# Patient Record
Sex: Female | Born: 1970 | Hispanic: Yes | Marital: Single | Smoking: Never smoker
Health system: Southern US, Community
[De-identification: ages and names within clinical notes are randomized; demographics above are authoritative.]

## PROBLEM LIST (undated history)

## (undated) DIAGNOSIS — I10 Essential (primary) hypertension: Secondary | ICD-10-CM

## (undated) DIAGNOSIS — G8929 Other chronic pain: Secondary | ICD-10-CM

## (undated) DIAGNOSIS — R109 Unspecified abdominal pain: Secondary | ICD-10-CM

## (undated) DIAGNOSIS — G43909 Migraine, unspecified, not intractable, without status migrainosus: Secondary | ICD-10-CM

## (undated) HISTORY — PX: HEMORROIDECTOMY: SUR656

## (undated) HISTORY — PX: TUBAL LIGATION: SHX77

---

## 2005-09-05 ENCOUNTER — Ambulatory Visit: Payer: Self-pay | Admitting: Family Medicine

## 2005-10-14 ENCOUNTER — Ambulatory Visit: Payer: Self-pay | Admitting: Family Medicine

## 2007-01-15 ENCOUNTER — Emergency Department: Payer: Self-pay

## 2013-06-16 ENCOUNTER — Ambulatory Visit: Payer: Self-pay | Admitting: Internal Medicine

## 2013-07-21 DIAGNOSIS — K219 Gastro-esophageal reflux disease without esophagitis: Secondary | ICD-10-CM | POA: Insufficient documentation

## 2013-07-21 DIAGNOSIS — G43909 Migraine, unspecified, not intractable, without status migrainosus: Secondary | ICD-10-CM | POA: Insufficient documentation

## 2014-08-02 DIAGNOSIS — I10 Essential (primary) hypertension: Secondary | ICD-10-CM | POA: Insufficient documentation

## 2014-09-06 ENCOUNTER — Other Ambulatory Visit: Payer: Self-pay | Admitting: Internal Medicine

## 2014-09-06 DIAGNOSIS — R1013 Epigastric pain: Secondary | ICD-10-CM

## 2014-09-12 ENCOUNTER — Ambulatory Visit: Payer: Self-pay

## 2014-09-20 ENCOUNTER — Ambulatory Visit
Admission: RE | Admit: 2014-09-20 | Discharge: 2014-09-20 | Disposition: A | Discharge status: Home / Self Care | Payer: Managed Care, Other (non HMO) | Source: Ambulatory Visit | Attending: Internal Medicine | Admitting: Internal Medicine

## 2014-09-20 DIAGNOSIS — R1013 Epigastric pain: Secondary | ICD-10-CM | POA: Diagnosis present

## 2015-02-23 DIAGNOSIS — R519 Headache, unspecified: Secondary | ICD-10-CM | POA: Insufficient documentation

## 2015-02-23 DIAGNOSIS — R51 Headache: Secondary | ICD-10-CM

## 2015-04-26 ENCOUNTER — Other Ambulatory Visit: Payer: Self-pay | Admitting: Internal Medicine

## 2015-04-26 DIAGNOSIS — D649 Anemia, unspecified: Secondary | ICD-10-CM | POA: Insufficient documentation

## 2015-04-26 DIAGNOSIS — K644 Residual hemorrhoidal skin tags: Secondary | ICD-10-CM | POA: Insufficient documentation

## 2015-04-26 DIAGNOSIS — Z1239 Encounter for other screening for malignant neoplasm of breast: Secondary | ICD-10-CM

## 2015-05-08 ENCOUNTER — Ambulatory Visit: Payer: Managed Care, Other (non HMO)

## 2016-03-20 ENCOUNTER — Emergency Department
Admission: EM | Admit: 2016-03-20 | Discharge: 2016-03-20 | Disposition: A | Discharge status: Home / Self Care | Payer: Managed Care, Other (non HMO) | Attending: Student in an Organized Health Care Education/Training Program | Admitting: Student in an Organized Health Care Education/Training Program

## 2016-03-20 ENCOUNTER — Emergency Department: Payer: Managed Care, Other (non HMO)

## 2016-03-20 ENCOUNTER — Encounter: Payer: Self-pay | Admitting: Emergency Medicine

## 2016-03-20 DIAGNOSIS — I10 Essential (primary) hypertension: Secondary | ICD-10-CM | POA: Diagnosis not present

## 2016-03-20 DIAGNOSIS — R109 Unspecified abdominal pain: Secondary | ICD-10-CM | POA: Diagnosis present

## 2016-03-20 DIAGNOSIS — R1011 Right upper quadrant pain: Secondary | ICD-10-CM

## 2016-03-20 DIAGNOSIS — R1013 Epigastric pain: Secondary | ICD-10-CM

## 2016-03-20 HISTORY — DX: Essential (primary) hypertension: I10

## 2016-03-20 HISTORY — DX: Unspecified abdominal pain: R10.9

## 2016-03-20 HISTORY — DX: Migraine, unspecified, not intractable, without status migrainosus: G43.909

## 2016-03-20 HISTORY — DX: Other chronic pain: G89.29

## 2016-03-20 LAB — COMPREHENSIVE METABOLIC PANEL
ALBUMIN: 4.1 g/dL (ref 3.5–5.0)
ALK PHOS: 62 U/L (ref 38–126)
ALT: 15 U/L (ref 14–54)
AST: 21 U/L (ref 15–41)
Anion gap: 5 (ref 5–15)
BILIRUBIN TOTAL: 0.6 mg/dL (ref 0.3–1.2)
BUN: 15 mg/dL (ref 6–20)
CALCIUM: 9.3 mg/dL (ref 8.9–10.3)
CO2: 22 mmol/L (ref 22–32)
CREATININE: 0.6 mg/dL (ref 0.44–1.00)
Chloride: 110 mmol/L (ref 101–111)
GFR calc Af Amer: >60 mL/min (ref >60)
GFR calc non Af Amer: >60 mL/min (ref >60)
GLUCOSE: 84 mg/dL (ref 65–99)
Potassium: 4.1 mmol/L (ref 3.5–5.1)
SODIUM: 137 mmol/L (ref 135–145)
TOTAL PROTEIN: 7.8 g/dL (ref 6.5–8.1)

## 2016-03-20 LAB — URINALYSIS, COMPLETE (UACMP) WITH MICROSCOPIC
Bacteria, UA: NONE SEEN
Bilirubin Urine: NEGATIVE
GLUCOSE, UA: NEGATIVE mg/dL
Ketones, ur: NEGATIVE mg/dL
Leukocytes, UA: NEGATIVE
NITRITE: NEGATIVE
PH: 5 (ref 5.0–8.0)
Protein, ur: NEGATIVE mg/dL
SPECIFIC GRAVITY, URINE: 1.016 (ref 1.005–1.030)

## 2016-03-20 LAB — LIPASE, BLOOD: Lipase: 37 U/L (ref 11–51)

## 2016-03-20 LAB — CBC
HCT: 39.1 % (ref 35.0–47.0)
Hemoglobin: 13.5 g/dL (ref 12.0–16.0)
MCH: 31.6 pg (ref 26.0–34.0)
MCHC: 34.6 g/dL (ref 32.0–36.0)
MCV: 91.4 fL (ref 80.0–100.0)
PLATELETS: 274 10*3/uL (ref 150–440)
RBC: 4.28 MIL/uL (ref 3.80–5.20)
RDW: 13.7 % (ref 11.5–14.5)
WBC: 6.2 10*3/uL (ref 3.6–11.0)

## 2016-03-20 LAB — POCT PREGNANCY, URINE: Preg Test, Ur: NEGATIVE

## 2016-03-20 MED ORDER — PANTOPRAZOLE SODIUM 20 MG PO TBEC: 20.0000 mg | DELAYED_RELEASE_TABLET | Freq: Two times a day (BID) | ORAL | 0 refills | Status: DC

## 2016-03-20 MED ORDER — DICYCLOMINE HCL 10 MG PO CAPS: 10.0000 mg | ORAL_CAPSULE | Freq: Three times a day (TID) | ORAL | 0 refills | Status: DC | PRN

## 2016-03-20 MED ORDER — DICYCLOMINE HCL 10 MG PO CAPS
10.0000 mg | ORAL_CAPSULE | Freq: Once | ORAL | Status: AC
  Administered 2016-03-20: 10 mg via ORAL
  Filled 2016-03-20 (×2): qty 1

## 2016-03-20 MED ORDER — GI COCKTAIL ~~LOC~~
30.0000 mL | Freq: Once | ORAL | Status: AC
  Administered 2016-03-20: 30 mL via ORAL
  Filled 2016-03-20: qty 30

## 2016-03-20 NOTE — ED Provider Notes (Signed)
[facility] Emergency Department Provider Note    First MD Initiated Contact with Patient 03/20/16 1404     (approximate)  I have reviewed the triage vital signs and the nursing notes.   HISTORY  Chief Complaint Abdominal Pain    HPI Ashley Mendoza is a 46 y.o. female  with a history of chronic abdominal pain presents with 3 weeks of progressively worsening postprandial abdominal pain.  This does have a family history of gallstones. States she's had this episode similar to this in the past but none particularly this worse. Currently rates her pain as 10 on a 10 in severity. No fevers. Does feel nauseated after eating but has not had any vomiting. No blood in her stools. Some breath or chest pain. Denies any dysuria or flank pain. States the pain occurs anywhere between 10 and 15 minutes after eating.   Past Medical History:  Diagnosis Date  . Chronic abdominal pain   . Hypertension   . Migraine    History reviewed. No pertinent family history. Past Surgical History:  Procedure Laterality Date  . HEMORROIDECTOMY    . TUBAL LIGATION     There are no active problems to display for this patient.     Prior to Admission medications   Not on File    Allergies Patient has no known allergies.    Social History Social History  Substance Use Topics  . Smoking status: Never Smoker  . Smokeless tobacco: Never Used  . Alcohol use No    Review of Systems Patient denies headaches, rhinorrhea, blurry vision, numbness, shortness of breath, chest pain, edema, cough, abdominal pain, nausea, vomiting, diarrhea, dysuria, fevers, rashes or hallucinations unless otherwise stated above in HPI. ____________________________________________   PHYSICAL EXAM:  VITAL SIGNS: Vitals:   03/20/16 1040  BP: (!) 154/91  Pulse: 70  Resp: 18  Temp: 97.5 F (36.4 C)    Constitutional: Alert and oriented. Well appearing and in no acute distress. Eyes:  Conjunctivae are normal. PERRL. EOMI. Head: Atraumatic. Nose: No congestion/rhinnorhea. Mouth/Throat: Mucous membranes are moist.  Oropharynx non-erythematous. Neck: No stridor. Painless ROM. No cervical spine tenderness to palpation Hematological/Lymphatic/Immunilogical: No cervical lymphadenopathy. Cardiovascular: Normal rate, regular rhythm. Grossly normal heart sounds.  Good peripheral circulation. Respiratory: Normal respiratory effort.  No retractions. Lungs CTAB. Gastrointestinal: Soft and nontender. No distention. No abdominal bruits. No CVA tenderness. Musculoskeletal: No lower extremity tenderness nor edema.  No joint effusions. Neurologic:  Normal speech and language. No gross focal neurologic deficits are appreciated. No gait instability. Skin:  Skin is warm, dry and intact. No rash noted. Psychiatric: Mood and affect are normal. Speech and behavior are normal.  ____________________________________________   LABS (all labs ordered are listed, but only abnormal results are displayed)  Results for orders placed or performed during the hospital encounter of 03/20/16 (from the past 24 hour(s))  Lipase, blood     Status: None   Collection Time: 03/20/16 10:41 AM  Result Value Ref Range   Lipase 37 11 - 51 U/L  Comprehensive metabolic panel     Status: None   Collection Time: 03/20/16 10:41 AM  Result Value Ref Range   Sodium 137 135 - 145 mmol/L   Potassium 4.1 3.5 - 5.1 mmol/L   Chloride 110 101 - 111 mmol/L   CO2 22 22 - 32 mmol/L   Glucose, Bld 84 65 - 99 mg/dL   BUN 15 6 - 20 mg/dL   Creatinine, Ser 1.61 0.44 - 1.00  mg/dL   Calcium 9.3 8.9 - 16.110.3 mg/dL   Total Protein 7.8 6.5 - 8.1 g/dL   Albumin 4.1 3.5 - 5.0 g/dL   AST 21 15 - 41 U/L   ALT 15 14 - 54 U/L   Alkaline Phosphatase 62 38 - 126 U/L   Total Bilirubin 0.6 0.3 - 1.2 mg/dL   GFR calc non Af Amer >60 >60 mL/min   GFR calc Af Amer >60 >60 mL/min   Anion gap 5 5 - 15  CBC     Status: None   Collection  Time: 03/20/16 10:41 AM  Result Value Ref Range   WBC 6.2 3.6 - 11.0 K/uL   RBC 4.28 3.80 - 5.20 MIL/uL   Hemoglobin 13.5 12.0 - 16.0 g/dL   HCT 09.639.1 04.535.0 - 40.947.0 %   MCV 91.4 80.0 - 100.0 fL   MCH 31.6 26.0 - 34.0 pg   MCHC 34.6 32.0 - 36.0 g/dL   RDW 81.113.7 91.411.5 - 78.214.5 %   Platelets 274 150 - 440 K/uL  Urinalysis, Complete w Microscopic     Status: Abnormal   Collection Time: 03/20/16 10:41 AM  Result Value Ref Range   Color, Urine YELLOW (A) YELLOW   APPearance CLEAR (A) CLEAR   Specific Gravity, Urine 1.016 1.005 - 1.030   pH 5.0 5.0 - 8.0   Glucose, UA NEGATIVE NEGATIVE mg/dL   Hgb urine dipstick MODERATE (A) NEGATIVE   Bilirubin Urine NEGATIVE NEGATIVE   Ketones, ur NEGATIVE NEGATIVE mg/dL   Protein, ur NEGATIVE NEGATIVE mg/dL   Nitrite NEGATIVE NEGATIVE   Leukocytes, UA NEGATIVE NEGATIVE   RBC / HPF 0-5 0 - 5 RBC/hpf   WBC, UA 0-5 0 - 5 WBC/hpf   Bacteria, UA NONE SEEN NONE SEEN   Squamous Epithelial / LPF 6-30 (A) NONE SEEN   Mucous PRESENT   Pregnancy, urine POC     Status: None   Collection Time: 03/20/16 10:52 AM  Result Value Ref Range   Preg Test, Ur NEGATIVE NEGATIVE   ____________________________________________ ____________________________________________  RADIOLOGY  I personally reviewed all radiographic images ordered to evaluate for the above acute complaints and reviewed radiology reports and findings.  These findings were personally discussed with the patient.  Please see medical record for radiology report.  ____________________________________________   PROCEDURES  Procedure(s) performed:  Procedures    Critical Care performed: no ____________________________________________   INITIAL IMPRESSION / ASSESSMENT AND PLAN / ED COURSE  Pertinent labs & imaging results that were available during my care of the patient were reviewed by me and considered in my medical decision making (see chart for details).  DDX: cholelithiasis, biliary colic,  gastritis, pud, esophagitis  Ashley Mendoza is a 46 y.o. who presents to the ED with complaint of postprandial abdominal pain that is been worsening over the past 3 weeks. Patient is afebrile and hemodynamically stable. Her abdominal exam is soft and benign. Her blood work is reassuring.  I do suspect this is some form of gastritis versus ulcer disease. Also the differential is biliary colic or cholelithiasis. We'll order a right upper quadrant ultrasound to evaluate for any stones or obstruction. We'll provide symptomatically management here in the ER.  Clinical Course as of Mar 21 1515  Wed Mar 20, 2016  1507 Vital quadrant ultrasound with no evidence of cholelithiasis. Presentation was consistent with gastritis. We'll start patient on PPI and follow-up with PCP.  Patient was able to tolerate PO and was able to ambulate with a  steady gait.  Have discussed with the patient and available family all diagnostics and treatments performed thus far and all questions were answered to the best of my ability. The patient demonstrates understanding and agreement with plan.   [PR]    Clinical Course User Index [PR] Willy Eddy, MD     ____________________________________________   FINAL CLINICAL IMPRESSION(S) / ED DIAGNOSES  Final diagnoses:  RUQ pain  Epigastric pain      NEW MEDICATIONS STARTED DURING THIS VISIT:  New Prescriptions   No medications on file     Note:  This document was prepared using Dragon voice recognition software and may include unintentional dictation errors.    Willy Eddy, MD 03/20/16 [phone]

## 2016-03-20 NOTE — ED Triage Notes (Signed)
C/o abdominal pain since a child but then over last 3 weeks has been worse.  Reports feels bloated when eats fruit. Has had nausea. No distress noted, appears well in triage.

## 2016-03-20 NOTE — ED Notes (Signed)
Pain upper abdomen and bloating for long time.  Has gotten worse for few days.  Says increases sometimes after eating, but it also hurts in am when she gets up.

## 2016-05-19 ENCOUNTER — Inpatient Hospital Stay
Admission: EM | Admit: 2016-05-19 | Discharge: 2016-05-21 | DRG: 669 | Disposition: A | Discharge status: Home / Self Care | Payer: 59 | Attending: Internal Medicine | Admitting: Internal Medicine

## 2016-05-19 ENCOUNTER — Emergency Department: Payer: 59

## 2016-05-19 DIAGNOSIS — K219 Gastro-esophageal reflux disease without esophagitis: Secondary | ICD-10-CM | POA: Diagnosis present

## 2016-05-19 DIAGNOSIS — N23 Unspecified renal colic: Secondary | ICD-10-CM | POA: Diagnosis present

## 2016-05-19 DIAGNOSIS — G8929 Other chronic pain: Secondary | ICD-10-CM | POA: Diagnosis present

## 2016-05-19 DIAGNOSIS — N201 Calculus of ureter: Secondary | ICD-10-CM | POA: Diagnosis not present

## 2016-05-19 DIAGNOSIS — Z79899 Other long term (current) drug therapy: Secondary | ICD-10-CM

## 2016-05-19 DIAGNOSIS — Z8679 Personal history of other diseases of the circulatory system: Secondary | ICD-10-CM

## 2016-05-19 DIAGNOSIS — N132 Hydronephrosis with renal and ureteral calculous obstruction: Secondary | ICD-10-CM | POA: Diagnosis present

## 2016-05-19 DIAGNOSIS — N200 Calculus of kidney: Secondary | ICD-10-CM | POA: Diagnosis present

## 2016-05-19 DIAGNOSIS — R1031 Right lower quadrant pain: Secondary | ICD-10-CM

## 2016-05-19 LAB — COMPREHENSIVE METABOLIC PANEL
ALBUMIN: 4.1 g/dL (ref 3.5–5.0)
ALT: 14 U/L (ref 14–54)
ANION GAP: 6 (ref 5–15)
AST: 23 U/L (ref 15–41)
Alkaline Phosphatase: 53 U/L (ref 38–126)
BILIRUBIN TOTAL: 0.4 mg/dL (ref 0.3–1.2)
BUN: 10 mg/dL (ref 6–20)
CHLORIDE: 108 mmol/L (ref 101–111)
CO2: 24 mmol/L (ref 22–32)
Calcium: 9 mg/dL (ref 8.9–10.3)
Creatinine, Ser: 0.63 mg/dL (ref 0.44–1.00)
GFR calc Af Amer: >60 mL/min (ref >60)
GFR calc non Af Amer: >60 mL/min (ref >60)
GLUCOSE: 133 mg/dL — AB (ref 65–99)
POTASSIUM: 4 mmol/L (ref 3.5–5.1)
SODIUM: 138 mmol/L (ref 135–145)
TOTAL PROTEIN: 7.5 g/dL (ref 6.5–8.1)

## 2016-05-19 LAB — CBC WITH DIFFERENTIAL/PLATELET
BASOS ABS: 0 10*3/uL (ref 0–0.1)
BASOS PCT: 0 %
EOS ABS: 0 10*3/uL (ref 0–0.7)
Eosinophils Relative: 0 %
HCT: 38 % (ref 35.0–47.0)
Hemoglobin: 13.1 g/dL (ref 12.0–16.0)
Lymphocytes Relative: 16 %
Lymphs Abs: 2 10*3/uL (ref 1.0–3.6)
MCH: 31.3 pg (ref 26.0–34.0)
MCHC: 34.4 g/dL (ref 32.0–36.0)
MCV: 91.1 fL (ref 80.0–100.0)
MONO ABS: 0.4 10*3/uL (ref 0.2–0.9)
MONOS PCT: 4 %
NEUTROS ABS: 9.8 10*3/uL — AB (ref 1.4–6.5)
Neutrophils Relative %: 80 %
PLATELETS: 315 10*3/uL (ref 150–440)
RBC: 4.18 MIL/uL (ref 3.80–5.20)
RDW: 13.6 % (ref 11.5–14.5)
WBC: 12.3 10*3/uL — ABNORMAL HIGH (ref 3.6–11.0)

## 2016-05-19 LAB — URINALYSIS, COMPLETE (UACMP) WITH MICROSCOPIC
BILIRUBIN URINE: NEGATIVE
GLUCOSE, UA: NEGATIVE mg/dL
KETONES UR: NEGATIVE mg/dL
Leukocytes, UA: NEGATIVE
Nitrite: NEGATIVE
PH: 6 (ref 5.0–8.0)
PROTEIN: NEGATIVE mg/dL
Specific Gravity, Urine: 1.016 (ref 1.005–1.030)

## 2016-05-19 LAB — LIPASE, BLOOD: Lipase: 19 U/L (ref 11–51)

## 2016-05-19 LAB — POCT PREGNANCY, URINE: Preg Test, Ur: NEGATIVE

## 2016-05-19 MED ORDER — FENTANYL CITRATE (PF) 100 MCG/2ML IJ SOLN
50.0000 ug | Freq: Once | INTRAMUSCULAR | Status: AC
  Administered 2016-05-19: 50 ug via INTRAVENOUS

## 2016-05-19 MED ORDER — FENTANYL CITRATE (PF) 100 MCG/2ML IJ SOLN
50.0000 ug | INTRAMUSCULAR | Status: DC | PRN
  Administered 2016-05-19: 50 ug via NASAL
  Filled 2016-05-19: qty 2

## 2016-05-19 MED ORDER — MORPHINE SULFATE (PF) 4 MG/ML IV SOLN
INTRAVENOUS | Status: AC
  Administered 2016-05-19: 4 mg via INTRAVENOUS
  Filled 2016-05-19: qty 1

## 2016-05-19 MED ORDER — FENTANYL CITRATE (PF) 100 MCG/2ML IJ SOLN
INTRAMUSCULAR | Status: AC
  Administered 2016-05-19: 50 ug via NASAL
  Filled 2016-05-19: qty 2

## 2016-05-19 MED ORDER — KETOROLAC TROMETHAMINE 30 MG/ML IJ SOLN
30.0000 mg | Freq: Once | INTRAMUSCULAR | Status: AC
  Administered 2016-05-19: 30 mg via INTRAVENOUS
  Filled 2016-05-19: qty 1

## 2016-05-19 MED ORDER — MORPHINE SULFATE (PF) 4 MG/ML IV SOLN
4.0000 mg | Freq: Once | INTRAVENOUS | Status: AC
  Administered 2016-05-19 (×2): 4 mg via INTRAVENOUS

## 2016-05-19 MED ORDER — SODIUM CHLORIDE 0.9 % IV BOLUS (SEPSIS)
1000.0000 mL | Freq: Once | INTRAVENOUS | Status: AC
  Administered 2016-05-19: 1000 mL via INTRAVENOUS

## 2016-05-19 MED ORDER — ONDANSETRON HCL 4 MG/2ML IJ SOLN
4.0000 mg | Freq: Once | INTRAMUSCULAR | Status: AC
  Administered 2016-05-19: 4 mg via INTRAVENOUS
  Filled 2016-05-19: qty 2

## 2016-05-19 NOTE — ED Notes (Signed)
Pt with right flank pain, nausea, emesis for 4 hours. Pt states history of renal calculi in past. Pt denies known fever. Pt writhing around in bed during assessment prior to administration of pain medication. Pt with warm and dry skin, normal color.

## 2016-05-19 NOTE — H&P (Signed)
History and Physical   [facility] [place] [facility] Admission History and Physical [employer] Lyndsay Talamante, D.O.    Patient Name: Ashley Mendoza MR#: [id number] Date of Birth: 04/11/1970 Date of Admission: 05/19/2016  Referring MD/NP/PA: Dr. Pershing ProudSchaevitz Primary Care Physician: Leotis ShamesSingh,Jasmine, MD  Patient coming from: Home  Chief Complaint: Flank pain  HPI: Ashley Dikesngrid Gerstner is a 46 y.o. female with a known history of GERD, chronic abdominal pain, hypertension, migraine, nephrolithiasis with stent in her 920s  was in a usual state of health until earlier this evening when she describes sudden onset of right flank pain radiating around to her groin associated with nausea, vomiting generally feeling unwell. She denies any fevers, chills.   Otherwise there has been no change in status. Patient has been taking medication as prescribed and there has been no recent change in medication or diet.  No recent antibiotics.  There has been no recent illness, hospitalizations, travel or sick contacts.    Patient denies fevers/chills, weakness, dizziness, chest pain, shortness of breath, N/V/C/D, abdominal pain, dysuria/frequency, changes in mental status.   ED Course: Patient rec'd Toradol, fentanyl, IVFs  Review of Systems:  CONSTITUTIONAL: No fever/chills, fatigue, weakness, weight gain/loss, headache. EYES: No blurry or double vision. ENT: No tinnitus, postnasal drip, redness or soreness of the oropharynx. RESPIRATORY: No cough, dyspnea, wheeze.  No hemoptysis.  CARDIOVASCULAR: No chest pain, palpitations, syncope, orthopnea. No lower extremity edema.  GASTROINTESTINAL: No nausea, vomiting, abdominal pain, diarrhea, constipation.  No hematemesis, melena or hematochezia. GENITOURINARY: No dysuria, frequency, hematuria. Positive right flank pain ENDOCRINE: No polyuria or nocturia. No heat or cold intolerance. HEMATOLOGY: No anemia, bruising, bleeding. INTEGUMENTARY: No rashes, ulcers,  lesions. MUSCULOSKELETAL: No arthritis, gout, dyspnea. NEUROLOGIC: No numbness, tingling, ataxia, seizure-type activity, weakness. PSYCHIATRIC: No anxiety, depression, insomnia.   Past Medical History:  Diagnosis Date  . Chronic abdominal pain   . Hypertension   . Migraine   Kidney stone with stenting in her 4320s  Past Surgical History:  Procedure Laterality Date  . HEMORROIDECTOMY    . TUBAL LIGATION       reports that she has never smoked. She has never used smokeless tobacco. She reports that she does not drink alcohol or use drugs.  No Known Allergies  Mother with hypertension, sister with diabetes type 2 Family history has been reviewed and confirmed with patient.   Prior to Admission medications   Medication Sig Start Date End Date Taking? Authorizing Provider  dicyclomine (BENTYL) 10 MG capsule Take 1 capsule (10 mg total) by mouth 3 (three) times daily as needed for spasms. 03/20/16 04/03/16  Willy EddyPatrick Robinson, MD  pantoprazole (PROTONIX) 20 MG tablet Take 1 tablet (20 mg total) by mouth 2 (two) times daily. 03/20/16 03/20/17  Willy EddyPatrick Robinson, MD    Physical Exam: Vitals:   05/19/16 2006 05/19/16 2007 05/19/16 2154  BP: (!) 153/92  131/84  Pulse: 68  73  Resp: (!) 22  (!) 22  Temp: 97.7 F (36.5 C)    TempSrc: Oral    SpO2: 100%  100%  Weight:  77.1 kg (170 lb)   Height:  5\' 1"  (1.549 m)     GENERAL: 46 y.o.-year-old female patient, well-developed, well-nourished lying in the bed in no acute distress.  Pleasant and cooperative.   HEENT: Head atraumatic, normocephalic. Pupils equal, round, reactive to light and accommodation. No scleral icterus. Extraocular muscles intact. Nares are patent. Oropharynx is clear. Mucus membranes moist. NECK: Supple, full range of motion. No JVD, no  bruit heard. No thyroid enlargement, no tenderness, no cervical lymphadenopathy. CHEST: Normal breath sounds bilaterally. No wheezing, rales, rhonchi or crackles. No use of accessory muscles  of respiration.  No reproducible chest wall tenderness.  CARDIOVASCULAR: S1, S2 normal. No murmurs, rubs, or gallops. Cap refill <2 seconds. Pulses intact distally.  ABDOMEN: Soft, nondistended, nontender. No rebound, guarding, rigidity. Normoactive bowel sounds present in all four quadrants. No organomegaly or mass.Positive CVA tenderness EXTREMITIES: No pedal edema, cyanosis, or clubbing. No calf tenderness or Homan's sign.  NEUROLOGIC: The patient is alert and oriented x 3. Cranial nerves II through XII are grossly intact with no focal sensorimotor deficit. Muscle strength 5/5 in all extremities. Sensation intact. Gait not checked. PSYCHIATRIC:  Normal affect, mood, thought content. SKIN: Warm, dry, and intact without obvious rash, lesion, or ulcer.    Labs on Admission:  CBC:  Recent Labs Lab 05/19/16 2018  WBC 12.3*  NEUTROABS 9.8*  HGB 13.1  HCT 38.0  MCV 91.1  PLT 315   Basic Metabolic Panel:  Recent Labs Lab 05/19/16 2018  NA 138  K 4.0  CL 108  CO2 24  GLUCOSE 133*  BUN 10  CREATININE 0.63  CALCIUM 9.0   GFR: Estimated Creatinine Clearance: 85.2 mL/min (by C-G formula based on SCr of 0.63 mg/dL). Liver Function Tests:  Recent Labs Lab 05/19/16 2018  AST 23  ALT 14  ALKPHOS 53  BILITOT 0.4  PROT 7.5  ALBUMIN 4.1    Recent Labs Lab 05/19/16 2018  LIPASE 19   No results for input(s): AMMONIA in the last 168 hours. Coagulation Profile: No results for input(s): INR, PROTIME in the last 168 hours. Cardiac Enzymes: No results for input(s): CKTOTAL, CKMB, CKMBINDEX, TROPONINI in the last 168 hours. BNP (last 3 results) No results for input(s): PROBNP in the last 8760 hours. HbA1C: No results for input(s): HGBA1C in the last 72 hours. CBG: No results for input(s): GLUCAP in the last 168 hours. Lipid Profile: No results for input(s): CHOL, HDL, LDLCALC, TRIG, CHOLHDL, LDLDIRECT in the last 72 hours. Thyroid Function Tests: No results for input(s):  TSH, T4TOTAL, FREET4, T3FREE, THYROIDAB in the last 72 hours. Anemia Panel: No results for input(s): VITAMINB12, FOLATE, FERRITIN, TIBC, IRON, RETICCTPCT in the last 72 hours. Urine analysis:    Component Value Date/Time   COLORURINE YELLOW (A) 05/19/2016 2018   APPEARANCEUR HAZY (A) 05/19/2016 2018   LABSPEC 1.016 05/19/2016 2018   PHURINE 6.0 05/19/2016 2018   GLUCOSEU NEGATIVE 05/19/2016 2018   HGBUR SMALL (A) 05/19/2016 2018   BILIRUBINUR NEGATIVE 05/19/2016 2018   KETONESUR NEGATIVE 05/19/2016 2018   PROTEINUR NEGATIVE 05/19/2016 2018   NITRITE NEGATIVE 05/19/2016 2018   LEUKOCYTESUR NEGATIVE 05/19/2016 2018   Sepsis Labs: @LABRCNTIP (procalcitonin:4,lacticidven:4) )No results found for this or any previous visit (from the past 240 hour(s)).   Radiological Exams on Admission: Ct Renal Stone Study  Result Date: 05/19/2016 CLINICAL DATA:  Pt with right flank pain, nausea, emesis for 4 hours. Pt states history of renal calculi in past. Pt denies known fever. EXAM: CT ABDOMEN AND PELVIS WITHOUT CONTRAST TECHNIQUE: Multidetector CT imaging of the abdomen and pelvis was performed following the standard protocol without IV contrast. COMPARISON:  None. FINDINGS: Lower chest: No acute abnormality. Hepatobiliary: No focal liver abnormality is seen. No gallstones, gallbladder wall thickening, or biliary dilatation. Pancreas: Unremarkable. No pancreatic ductal dilatation or surrounding inflammatory changes. Spleen: Normal in size without focal abnormality. Adrenals/Urinary Tract: 10 mm obstructing stone at the right  UPJ causing moderate hydronephrosis and perinephric edema. Probable additional 4 mm stone at the UPJ. No left-sided renal stone or hydronephrosis. Left renal cyst measuring 2.2 cm. Bladder is partially decompressed. Stomach/Bowel: Bowel is normal in caliber. No bowel wall thickening or evidence of bowel wall inflammation. Appendix is normal. Vascular/Lymphatic: Abdominal aorta is normal  in caliber. No enlarged lymph nodes seen. Reproductive: Uterus and bilateral adnexa are unremarkable. Other: No abscess collection.  No free intraperitoneal air. Musculoskeletal: No acute or suspicious osseous finding. Small periumbilical abdominal hernia which contains fat only. IMPRESSION: Obstructing 10 mm stone at the right UPJ causing moderate hydronephrosis and perinephric edema. Additional adjacent 4 mm stone at the right UPJ. Electronically Signed   By: Bary Richard M.D.   On: 05/19/2016 21:43    Assessment/Plan   This is a 46 y.o. female with a history of cGERD, hronic abdominal pain, hypertension, migraine now being admitted with:  1. Renal colic secondary to obstructing 10 mm stone at the right UPJ with moderate hydro-and perinephric edema. Also adjacent 4 mm stone at the right UPJ -Admit inpatient -IV fluids -Pain control -Urology consultation has been requested by the emergency department  2. History of hypertension - Monitor   3. History of migraine - Monitor  4. History of GERD/chronic abdominal pain - Continue Protonix, Bentyl  Admission status: Inpatient IV Fluids: Normal saline Diet/Nutrition: Nothing by mouth after midnight Consults called: Urology  DVT Px: Lovenox, SCDs and early ambulation. Code Status: Full Code  Disposition Plan: To home in 1-2 days   All the records are reviewed and case discussed with ED provider. Management plans discussed with the patient and/or family who express understanding and agree with plan of care.  Mattisen Pohlmann D.O. on 05/19/2016 at 10:52 PM Between 7am to 6pm - Pager - [phone] After 6pm go to [email or web address] - Social research officer, government [facility] Office [phone] CC: Primary care physician; Leotis Shames, MD   05/19/2016, 10:52 PM

## 2016-05-19 NOTE — ED Provider Notes (Signed)
[facility] Emergency Department Provider Note  ____________________________________________   First MD Initiated Contact with Patient 05/19/16 2040     (approximate)  I have reviewed the triage vital signs and the nursing notes.   HISTORY  Chief Complaint Flank Pain   HPI Ashley Mendoza is a 46 y.o. female with a history of kidney stones and stenting was presenting to the emergency department today with sudden onset right flank pain. Said the pain started about an hour prior to arrival. She describes as a "2010." She says that she has had nausea and vomiting and the pain feels sharp. Feels similar to previous kidney stone.   Past Medical History:  Diagnosis Date  . Chronic abdominal pain   . Hypertension   . Migraine     There are no active problems to display for this patient.   Past Surgical History:  Procedure Laterality Date  . HEMORROIDECTOMY    . TUBAL LIGATION      Prior to Admission medications   Medication Sig Start Date End Date Taking? Authorizing Provider  dicyclomine (BENTYL) 10 MG capsule Take 1 capsule (10 mg total) by mouth 3 (three) times daily as needed for spasms. 03/20/16 04/03/16  Willy Eddy, MD  pantoprazole (PROTONIX) 20 MG tablet Take 1 tablet (20 mg total) by mouth 2 (two) times daily. 03/20/16 03/20/17  Willy Eddy, MD    Allergies Patient has no known allergies.  No family history on file.  Social History Social History  Substance Use Topics  . Smoking status: Never Smoker  . Smokeless tobacco: Never Used  . Alcohol use No    Review of Systems Constitutional: No fever/chills Eyes: No visual changes. ENT: No sore throat. Cardiovascular: Denies chest pain. Respiratory: Denies shortness of breath. Gastrointestinal: No diarrhea.  No constipation. Genitourinary: Negative for dysuria. Musculoskeletal: right flankl pain Skin: Negative for rash. Neurological: Negative for headaches, focal weakness or  numbness.  10-point ROS otherwise negative.  ____________________________________________   PHYSICAL EXAM:  VITAL SIGNS: ED Triage Vitals  Enc Vitals Group     BP 05/19/16 2006 (!) 153/92     Pulse Rate 05/19/16 2006 68     Resp 05/19/16 2006 (!) 22     Temp 05/19/16 2006 97.7 F (36.5 C)     Temp Source 05/19/16 2006 Oral     SpO2 05/19/16 2006 100 %     Weight 05/19/16 2007 170 lb (77.1 kg)     Height 05/19/16 2007 5\' 1"  (1.549 m)     Head Circumference --      Peak Flow --      Pain Score 05/19/16 2007 10     Pain Loc --      Pain Edu? --      Excl. in GC? --     Constitutional: Alert and oriented. Patient in moderate distress, lying on her left side and rolling around the bed. Eyes: Conjunctivae are normal. PERRL. EOMI. Head: Atraumatic. Nose: No congestion/rhinnorhea. Mouth/Throat: Mucous membranes are moist.   Neck: No stridor.   Cardiovascular: Normal rate, regular rhythm. Grossly normal heart sounds.   Respiratory: Normal respiratory effort.  No retractions. Lungs CTAB. Gastrointestinal: Soft With moderate to severe right sided abdominal tenderness to palpation as well as moderate right-sided CVA tenderness palpation. Musculoskeletal: No lower extremity tenderness nor edema.  No joint effusions. Neurologic:  Normal speech and language. No gross focal neurologic deficits are appreciated.  Skin:  Skin is warm, dry and intact. No rash noted.  Psychiatric:Speech and behavior are appropriate ____________________________________________   LABS (all labs ordered are listed, but only abnormal results are displayed)  Labs Reviewed  URINALYSIS, COMPLETE (UACMP) WITH MICROSCOPIC - Abnormal; Notable for the following:       Result Value   Color, Urine YELLOW (*)    APPearance HAZY (*)    Hgb urine dipstick SMALL (*)    Bacteria, UA RARE (*)    Squamous Epithelial / LPF 6-30 (*)    All other components within normal limits  CBC WITH DIFFERENTIAL/PLATELET - Abnormal;  Notable for the following:    WBC 12.3 (*)    Neutro Abs 9.8 (*)    All other components within normal limits  COMPREHENSIVE METABOLIC PANEL - Abnormal; Notable for the following:    Glucose, Bld 133 (*)    All other components within normal limits  LIPASE, BLOOD  POCT PREGNANCY, URINE  POC URINE PREG, ED   ____________________________________________  EKG   ____________________________________________  RADIOLOGY  CT RENAL STONE STUDY (Final result)  Result time 05/19/16 21:43:29  Final result by Corky Sox, MD (05/19/16 21:43:29)           Narrative:   CLINICAL DATA: Pt with right flank pain, nausea, emesis for 4 hours. Pt states history of renal calculi in past. Pt denies known fever.  EXAM: CT ABDOMEN AND PELVIS WITHOUT CONTRAST  TECHNIQUE: Multidetector CT imaging of the abdomen and pelvis was performed following the standard protocol without IV contrast.  COMPARISON: None.  FINDINGS: Lower chest: No acute abnormality.  Hepatobiliary: No focal liver abnormality is seen. No gallstones, gallbladder wall thickening, or biliary dilatation.  Pancreas: Unremarkable. No pancreatic ductal dilatation or surrounding inflammatory changes.  Spleen: Normal in size without focal abnormality.  Adrenals/Urinary Tract: 10 mm obstructing stone at the right UPJ causing moderate hydronephrosis and perinephric edema. Probable additional 4 mm stone at the UPJ.  No left-sided renal stone or hydronephrosis. Left renal cyst measuring 2.2 cm. Bladder is partially decompressed.  Stomach/Bowel: Bowel is normal in caliber. No bowel wall thickening or evidence of bowel wall inflammation. Appendix is normal.  Vascular/Lymphatic: Abdominal aorta is normal in caliber. No enlarged lymph nodes seen.  Reproductive: Uterus and bilateral adnexa are unremarkable.  Other: No abscess collection. No free intraperitoneal air.  Musculoskeletal: No acute or suspicious osseous  finding. Small periumbilical abdominal hernia which contains fat only.  IMPRESSION: Obstructing 10 mm stone at the right UPJ causing moderate hydronephrosis and perinephric edema. Additional adjacent 4 mm stone at the right UPJ.   Electronically Signed By: Bary Richard M.D. On: 05/19/2016 21:43          ____________________________________________   PROCEDURES  Procedure(s) performed:   Procedures  Critical Care performed:   ____________________________________________   INITIAL IMPRESSION / ASSESSMENT AND PLAN / ED COURSE  Pertinent labs & imaging results that were available during my care of the patient were reviewed by me and considered in my medical decision making (see chart for details).  ----------------------------------------- 10:41 PM on 05/19/2016 -----------------------------------------  After 2 doses of fentanyl as well as 1 dose of Toradol the patient is still complaining of intermittent pain 6 out of 10. While I'm in the room she is first calm and then holds her side and winces. Discussed the case with Dr. Annabell Howells of urology. Patient does not appear septic and he recommends admission for pain control and possible stent. However, due to the patient's reassuring urinalysis and only mildly elevated white blood cell count he does not  think it appropriate to send this time. Patient is understanding the plan for admission hospital. Signed out to Dr. Emmit PomfretHugelmeyer.       ____________________________________________   FINAL CLINICAL IMPRESSION(S) / ED DIAGNOSES  Final diagnoses:  Right lower quadrant abdominal pain   Kidney stone   NEW MEDICATIONS STARTED DURING THIS VISIT:  New Prescriptions   No medications on file     Note:  This document was prepared using Dragon voice recognition software and may include unintentional dictation errors.    Myrna Blazeravid Matthew Schaevitz, MD 05/19/16 [phone]

## 2016-05-20 ENCOUNTER — Encounter
Admission: EM | Disposition: A | Discharge status: Home / Self Care | Payer: Self-pay | Source: Home / Self Care | Attending: Internal Medicine

## 2016-05-20 ENCOUNTER — Inpatient Hospital Stay: Payer: 59 | Admitting: Certified Registered"

## 2016-05-20 ENCOUNTER — Encounter: Payer: Self-pay | Admitting: Family Medicine

## 2016-05-20 DIAGNOSIS — R1031 Right lower quadrant pain: Secondary | ICD-10-CM

## 2016-05-20 DIAGNOSIS — N200 Calculus of kidney: Secondary | ICD-10-CM

## 2016-05-20 DIAGNOSIS — N201 Calculus of ureter: Secondary | ICD-10-CM

## 2016-05-20 HISTORY — PX: CYSTOSCOPY WITH STENT PLACEMENT: SHX5790

## 2016-05-20 HISTORY — PX: URETEROSCOPY WITH HOLMIUM LASER LITHOTRIPSY: SHX6645

## 2016-05-20 LAB — BASIC METABOLIC PANEL
Anion gap: 5 (ref 5–15)
BUN: 10 mg/dL (ref 6–20)
CHLORIDE: 112 mmol/L — AB (ref 101–111)
CO2: 20 mmol/L — AB (ref 22–32)
Calcium: 8.4 mg/dL — ABNORMAL LOW (ref 8.9–10.3)
Creatinine, Ser: 0.55 mg/dL (ref 0.44–1.00)
GFR calc non Af Amer: >60 mL/min (ref >60)
Glucose, Bld: 126 mg/dL — ABNORMAL HIGH (ref 65–99)
POTASSIUM: 3.5 mmol/L (ref 3.5–5.1)
SODIUM: 137 mmol/L (ref 135–145)

## 2016-05-20 LAB — CBC
HCT: 35.7 % (ref 35.0–47.0)
Hemoglobin: 12.3 g/dL (ref 12.0–16.0)
MCH: 31.8 pg (ref 26.0–34.0)
MCHC: 34.4 g/dL (ref 32.0–36.0)
MCV: 92.4 fL (ref 80.0–100.0)
PLATELETS: 284 10*3/uL (ref 150–440)
RBC: 3.87 MIL/uL (ref 3.80–5.20)
RDW: 13.5 % (ref 11.5–14.5)
WBC: 9.2 10*3/uL (ref 3.6–11.0)

## 2016-05-20 SURGERY — URETEROSCOPY, WITH LITHOTRIPSY USING HOLMIUM LASER
Anesthesia: General | Site: Ureter | Laterality: Right | Wound class: Clean Contaminated

## 2016-05-20 MED ORDER — ONDANSETRON HCL 4 MG/2ML IJ SOLN: 4.0000 mg | Freq: Once | INTRAMUSCULAR | Status: DC | PRN

## 2016-05-20 MED ORDER — OXYBUTYNIN CHLORIDE 5 MG PO TABS: 5.0000 mg | ORAL_TABLET | Freq: Three times a day (TID) | ORAL | 0 refills | Status: AC | PRN

## 2016-05-20 MED ORDER — ONDANSETRON HCL 4 MG/2ML IJ SOLN
INTRAMUSCULAR | Status: AC
  Filled 2016-05-20: qty 2

## 2016-05-20 MED ORDER — SUCCINYLCHOLINE CHLORIDE 20 MG/ML IJ SOLN
INTRAMUSCULAR | Status: AC
  Filled 2016-05-20: qty 1

## 2016-05-20 MED ORDER — FENTANYL CITRATE (PF) 100 MCG/2ML IJ SOLN
INTRAMUSCULAR | Status: AC
  Filled 2016-05-20: qty 2

## 2016-05-20 MED ORDER — SODIUM CHLORIDE 0.9 % IV SOLN
INTRAVENOUS | Status: DC
  Administered 2016-05-20 – 2016-05-21 (×3): via INTRAVENOUS

## 2016-05-20 MED ORDER — OXYCODONE HCL 5 MG PO TABS
5.0000 mg | ORAL_TABLET | ORAL | Status: DC | PRN
  Administered 2016-05-20 – 2016-05-21 (×4): 5 mg via ORAL
  Filled 2016-05-20 (×4): qty 1

## 2016-05-20 MED ORDER — TAMSULOSIN HCL 0.4 MG PO CAPS: 0.4000 mg | ORAL_CAPSULE | Freq: Every day | ORAL | 0 refills | Status: AC

## 2016-05-20 MED ORDER — DICYCLOMINE HCL 10 MG PO CAPS: 10.0000 mg | ORAL_CAPSULE | Freq: Three times a day (TID) | ORAL | Status: DC | PRN

## 2016-05-20 MED ORDER — DEXAMETHASONE SODIUM PHOSPHATE 10 MG/ML IJ SOLN
INTRAMUSCULAR | Status: DC | PRN
  Administered 2016-05-20: 4 mg via INTRAVENOUS

## 2016-05-20 MED ORDER — LIDOCAINE HCL (PF) 2 % IJ SOLN
INTRAMUSCULAR | Status: AC
  Filled 2016-05-20: qty 2

## 2016-05-20 MED ORDER — IOTHALAMATE MEGLUMINE 43 % IV SOLN
INTRAVENOUS | Status: DC | PRN
  Administered 2016-05-20: 15 mL

## 2016-05-20 MED ORDER — FENTANYL CITRATE (PF) 100 MCG/2ML IJ SOLN
INTRAMUSCULAR | Status: DC | PRN
  Administered 2016-05-20: 75 ug via INTRAVENOUS

## 2016-05-20 MED ORDER — SUCCINYLCHOLINE CHLORIDE 20 MG/ML IJ SOLN
INTRAMUSCULAR | Status: DC | PRN
  Administered 2016-05-20: 100 mg via INTRAVENOUS

## 2016-05-20 MED ORDER — ACETAMINOPHEN 650 MG RE SUPP: 650.0000 mg | Freq: Four times a day (QID) | RECTAL | Status: DC | PRN

## 2016-05-20 MED ORDER — MIDAZOLAM HCL 2 MG/2ML IJ SOLN
INTRAMUSCULAR | Status: AC
  Filled 2016-05-20: qty 2

## 2016-05-20 MED ORDER — KETOROLAC TROMETHAMINE 30 MG/ML IJ SOLN: 30.0000 mg | Freq: Four times a day (QID) | INTRAMUSCULAR | Status: DC | PRN

## 2016-05-20 MED ORDER — HEPARIN SODIUM (PORCINE) 5000 UNIT/ML IJ SOLN
5000.0000 [IU] | Freq: Three times a day (TID) | INTRAMUSCULAR | Status: DC
  Administered 2016-05-20 – 2016-05-21 (×4): 5000 [IU] via SUBCUTANEOUS
  Filled 2016-05-20 (×4): qty 1

## 2016-05-20 MED ORDER — BISACODYL 5 MG PO TBEC: 5.0000 mg | DELAYED_RELEASE_TABLET | Freq: Every day | ORAL | Status: DC | PRN

## 2016-05-20 MED ORDER — PROPOFOL 10 MG/ML IV BOLUS
INTRAVENOUS | Status: AC
  Filled 2016-05-20: qty 20

## 2016-05-20 MED ORDER — ONDANSETRON HCL 4 MG/2ML IJ SOLN
4.0000 mg | Freq: Four times a day (QID) | INTRAMUSCULAR | Status: DC | PRN
  Administered 2016-05-20: 4 mg via INTRAVENOUS

## 2016-05-20 MED ORDER — LIDOCAINE HCL (CARDIAC) 20 MG/ML IV SOLN
INTRAVENOUS | Status: DC | PRN
  Administered 2016-05-20: 80 mg via INTRAVENOUS

## 2016-05-20 MED ORDER — DEXAMETHASONE SODIUM PHOSPHATE 10 MG/ML IJ SOLN
INTRAMUSCULAR | Status: AC
  Filled 2016-05-20: qty 1

## 2016-05-20 MED ORDER — ALBUTEROL SULFATE (2.5 MG/3ML) 0.083% IN NEBU: 2.5000 mg | INHALATION_SOLUTION | Freq: Four times a day (QID) | RESPIRATORY_TRACT | Status: DC | PRN

## 2016-05-20 MED ORDER — HYDROCODONE-ACETAMINOPHEN 5-325 MG PO TABS: 1.0000 | ORAL_TABLET | Freq: Four times a day (QID) | ORAL | 0 refills | Status: AC | PRN

## 2016-05-20 MED ORDER — ONDANSETRON HCL 4 MG PO TABS: 4.0000 mg | ORAL_TABLET | Freq: Four times a day (QID) | ORAL | Status: DC | PRN

## 2016-05-20 MED ORDER — CEFAZOLIN IN D5W 1 GM/50ML IV SOLN
1.0000 g | Freq: Once | INTRAVENOUS | Status: AC
  Administered 2016-05-20: 1 g via INTRAVENOUS
  Filled 2016-05-20: qty 50

## 2016-05-20 MED ORDER — ROCURONIUM BROMIDE 100 MG/10ML IV SOLN
INTRAVENOUS | Status: DC | PRN
  Administered 2016-05-20: 5 mg via INTRAVENOUS
  Administered 2016-05-20: 30 mg via INTRAVENOUS

## 2016-05-20 MED ORDER — PHENYLEPHRINE HCL 10 MG/ML IJ SOLN
INTRAMUSCULAR | Status: AC
  Filled 2016-05-20: qty 1

## 2016-05-20 MED ORDER — SENNOSIDES-DOCUSATE SODIUM 8.6-50 MG PO TABS: 1.0000 | ORAL_TABLET | Freq: Every evening | ORAL | Status: DC | PRN

## 2016-05-20 MED ORDER — PANTOPRAZOLE SODIUM 20 MG PO TBEC
20.0000 mg | DELAYED_RELEASE_TABLET | Freq: Two times a day (BID) | ORAL | Status: DC
  Filled 2016-05-20: qty 1

## 2016-05-20 MED ORDER — MORPHINE SULFATE (PF) 2 MG/ML IV SOLN: 1.0000 mg | INTRAVENOUS | Status: DC | PRN

## 2016-05-20 MED ORDER — IPRATROPIUM BROMIDE 0.02 % IN SOLN: 0.5000 mg | Freq: Four times a day (QID) | RESPIRATORY_TRACT | Status: DC | PRN

## 2016-05-20 MED ORDER — PANTOPRAZOLE SODIUM 40 MG PO TBEC
40.0000 mg | DELAYED_RELEASE_TABLET | Freq: Two times a day (BID) | ORAL | Status: DC
  Administered 2016-05-20 – 2016-05-21 (×2): 40 mg via ORAL
  Filled 2016-05-20 (×2): qty 1

## 2016-05-20 MED ORDER — ROCURONIUM BROMIDE 50 MG/5ML IV SOLN
INTRAVENOUS | Status: AC
  Filled 2016-05-20: qty 1

## 2016-05-20 MED ORDER — FENTANYL CITRATE (PF) 100 MCG/2ML IJ SOLN: 25.0000 ug | INTRAMUSCULAR | Status: DC | PRN

## 2016-05-20 MED ORDER — MAGNESIUM CITRATE PO SOLN
1.0000 | Freq: Once | ORAL | Status: DC | PRN
  Filled 2016-05-20: qty 296

## 2016-05-20 MED ORDER — ACETAMINOPHEN 325 MG PO TABS: 650.0000 mg | ORAL_TABLET | Freq: Four times a day (QID) | ORAL | Status: DC | PRN

## 2016-05-20 MED ORDER — SUGAMMADEX SODIUM 200 MG/2ML IV SOLN
INTRAVENOUS | Status: DC | PRN
  Administered 2016-05-20: 154.2 mg via INTRAVENOUS

## 2016-05-20 MED ORDER — SUGAMMADEX SODIUM 200 MG/2ML IV SOLN
INTRAVENOUS | Status: AC
  Filled 2016-05-20: qty 2

## 2016-05-20 MED ORDER — PROPOFOL 10 MG/ML IV BOLUS
INTRAVENOUS | Status: DC | PRN
  Administered 2016-05-20: 140 mg via INTRAVENOUS

## 2016-05-20 MED ORDER — MIDAZOLAM HCL 2 MG/2ML IJ SOLN
INTRAMUSCULAR | Status: DC | PRN
  Administered 2016-05-20: 2 mg via INTRAVENOUS

## 2016-05-20 SURGICAL SUPPLY — 32 items
BAG DRAIN CYSTO-URO LG1000N (MISCELLANEOUS) ×3 IMPLANT
BASKET ZERO TIP 1.9FR (BASKET) ×3 IMPLANT
CATH URETL 5X70 OPEN END (CATHETERS) ×3 IMPLANT
CNTNR SPEC 2.5X3XGRAD LEK (MISCELLANEOUS) ×1
CONRAY 43 FOR UROLOGY 50M (MISCELLANEOUS) ×3 IMPLANT
CONT SPEC 4OZ STER OR WHT (MISCELLANEOUS) ×2
CONTAINER SPEC 2.5X3XGRAD LEK (MISCELLANEOUS) ×1 IMPLANT
DRAPE UTILITY 15X26 TOWEL STRL (DRAPES) ×3 IMPLANT
FIBER LASER LITHO 273 (Laser) ×3 IMPLANT
GLOVE BIO SURGEON STRL SZ 6.5 (GLOVE) ×2 IMPLANT
GLOVE BIO SURGEONS STRL SZ 6.5 (GLOVE) ×1
GOWN STRL REUS W/ TWL LRG LVL3 (GOWN DISPOSABLE) ×2 IMPLANT
GOWN STRL REUS W/TWL LRG LVL3 (GOWN DISPOSABLE) ×4
GUIDEWIRE GREEN .038 145CM (MISCELLANEOUS) IMPLANT
GUIDEWIRE SUPER STIFF (WIRE) ×3 IMPLANT
INFUSOR MANOMETER BAG 3000ML (MISCELLANEOUS) ×3 IMPLANT
INTRODUCER DILATOR DOUBLE (INTRODUCER) IMPLANT
KIT RM TURNOVER CYSTO AR (KITS) ×3 IMPLANT
PACK CYSTO AR (MISCELLANEOUS) ×3 IMPLANT
SCRUB POVIDONE IODINE 4 OZ (MISCELLANEOUS) ×3 IMPLANT
SENSORWIRE 0.038 NOT ANGLED (WIRE) ×3
SET CYSTO W/LG BORE CLAMP LF (SET/KITS/TRAYS/PACK) ×3 IMPLANT
SHEATH URETERAL 12FRX35CM (MISCELLANEOUS) ×3 IMPLANT
SOL .9 NS 3000ML IRR  AL (IV SOLUTION) ×2
SOL .9 NS 3000ML IRR UROMATIC (IV SOLUTION) ×1 IMPLANT
STENT URET 6FRX22 CONTOUR (STENTS) ×3 IMPLANT
STENT URET 6FRX24 CONTOUR (STENTS) IMPLANT
STENT URET 6FRX26 CONTOUR (STENTS) IMPLANT
SURGILUBE 2OZ TUBE FLIPTOP (MISCELLANEOUS) ×3 IMPLANT
SYRINGE IRR TOOMEY STRL 70CC (SYRINGE) ×3 IMPLANT
WATER STERILE IRR 1000ML POUR (IV SOLUTION) ×3 IMPLANT
WIRE SENSOR 0.038 NOT ANGLED (WIRE) ×1 IMPLANT

## 2016-05-20 NOTE — ED Notes (Signed)
Pt up to restroom to void.  

## 2016-05-20 NOTE — Consult Note (Signed)
Urology Consult  I have been asked to see the patient by Dr. Pershing Proud, for evaluation and management of right ureteral stone.  Chief Complaint: right flank pain  History of Present Illness: Ashley Mendoza is a 46 y.o. year old who presented yesterday evening to the emergency room with acute onset severe right flank pain. This was associated with nausea, vomiting, and chills but no fevers. No urinary symptoms including no dysuria, urgency, or frequency.  Noncontrast CT scan showed a 10 mm obstructing stone at the right UPJ with moderate hydronephrosis and perinephric edema.   Labs are otherwise unremarkable including negative urinalysis.  She does have a personal history of kidney stones. She underwent ureteroscopy 18 years ago with stent and does not have an episode since.  Past Medical History:  Diagnosis Date  . Chronic abdominal pain   . Hypertension   . Migraine     Past Surgical History:  Procedure Laterality Date  . HEMORROIDECTOMY    . TUBAL LIGATION      Home Medications:  Current Meds  Medication Sig  . baclofen (LIORESAL) 10 MG tablet Take 10 mg by mouth 2 (two) times daily.  Marland Kitchen dicyclomine (BENTYL) 10 MG capsule Take 10 mg by mouth 3 (three) times daily before meals.  . pantoprazole (PROTONIX) 20 MG tablet Take 20 mg by mouth 2 (two) times daily.  . QUDEXY XR 100 MG CS24 Take 1 capsule by mouth daily.    Allergies: No Known Allergies  Family History  Problem Relation Age of Onset  . Diabetes Mother   . High blood pressure Mother   . Diabetes Sister     Social History:  reports that she has never smoked. She has never used smokeless tobacco. She reports that she does not drink alcohol or use drugs.  ROS: A complete review of systems was performed.  All systems are negative except for pertinent findings as noted.  Physical Exam:  Vital signs in last 24 hours: Temp:  [97.7 F (36.5 C)-98.3 F (36.8 C)] 98 F (36.7 C) (03/05 0554) Pulse Rate:   [52-73] 52 (03/05 0554) Resp:  [18-22] 20 (03/05 0554) BP: (110-153)/(76-92) 110/76 (03/05 0554) SpO2:  [98 %-100 %] 99 % (03/05 0554) Weight:  [170 lb (77.1 kg)] 170 lb (77.1 kg) (03/04 2007) Constitutional:  Alert and oriented, No acute distress.  Daughter at bedside. HEENT: [place] AT, moist mucus membranes.  Trachea midline, no masses Cardiovascular: Regular rate and rhythm, no clubbing, cyanosis, or edema. Respiratory: Normal respiratory effort, lungs clear bilaterally GI: Abdomen is soft, nontender, nondistended, no abdominal masses GU: + Right CVA tenderness. Skin: No rashes, bruises or suspicious lesions Lymph: No cervical adenopathy Neurologic: Grossly intact, no focal deficits, moving all 4 extremities Psychiatric: Normal mood and affect   Laboratory Data:   Recent Labs  05/19/16 2018 05/20/16 0416  WBC 12.3* 9.2  HGB 13.1 12.3  HCT 38.0 35.7    Recent Labs  05/19/16 2018 05/20/16 0416  NA 138 137  K 4.0 3.5  CL 108 112*  CO2 24 20*  GLUCOSE 133* 126*  BUN 10 10  CREATININE 0.63 0.55  CALCIUM 9.0 8.4*   No results for input(s): LABPT, INR in the last 72 hours. No results for input(s): LABURIN in the last 72 hours. No results found for this or any previous visit.   Radiologic Imaging: Ct Renal Stone Study  Result Date: 05/19/2016 CLINICAL DATA:  Pt with right flank pain, nausea, emesis for 4 hours.  Pt states history of renal calculi in past. Pt denies known fever. EXAM: CT ABDOMEN AND PELVIS WITHOUT CONTRAST TECHNIQUE: Multidetector CT imaging of the abdomen and pelvis was performed following the standard protocol without IV contrast. COMPARISON:  None. FINDINGS: Lower chest: No acute abnormality. Hepatobiliary: No focal liver abnormality is seen. No gallstones, gallbladder wall thickening, or biliary dilatation. Pancreas: Unremarkable. No pancreatic ductal dilatation or surrounding inflammatory changes. Spleen: Normal in size without focal abnormality.  Adrenals/Urinary Tract: 10 mm obstructing stone at the right UPJ causing moderate hydronephrosis and perinephric edema. Probable additional 4 mm stone at the UPJ. No left-sided renal stone or hydronephrosis. Left renal cyst measuring 2.2 cm. Bladder is partially decompressed. Stomach/Bowel: Bowel is normal in caliber. No bowel wall thickening or evidence of bowel wall inflammation. Appendix is normal. Vascular/Lymphatic: Abdominal aorta is normal in caliber. No enlarged lymph nodes seen. Reproductive: Uterus and bilateral adnexa are unremarkable. Other: No abscess collection.  No free intraperitoneal air. Musculoskeletal: No acute or suspicious osseous finding. Small periumbilical abdominal hernia which contains fat only. IMPRESSION: Obstructing 10 mm stone at the right UPJ causing moderate hydronephrosis and perinephric edema. Additional adjacent 4 mm stone at the right UPJ. Electronically Signed   By: Bary RichardStan  Maynard M.D.   On: 05/19/2016 21:43   CT scan personally reviewed today.  Impression/Assessment:  46 year old female admitted with a 10 mm obstructing right UPJ stone for refractory pain. Her pain was unable to be controlled in the emergency room.  Given the size and location of the stone, I think is unlikely that she will pass this on her own. As such, she was counseled to undergo ureteroscopic intervention for the stone.  Risks and benefits of ureteroscopy were reviewed including but not limited to infection, bleeding, pain, ureteral injury which could require open surgery versus prolonged indwelling if ureteralperforation occurs, persistent stone disease, requirement for staged procedure, possible stent, and global anesthesia risks. Patient expressed understanding and desires to proceed with ureteroscopy.  Plan:  -NPO -Informed consent obtained -Site marked on the right -Antibiotics on call to OR -Plan for procedure later this morning -Patient can be discharged following the procedure, will  provide scripts in the chart  05/20/2016, 7:56 AM  Vanna ScotlandAshley Mendel Binsfeld,  MD

## 2016-05-20 NOTE — Discharge Instructions (Signed)
You have a ureteral stent in place.  This is a tube that extends from your kidney to your bladder.  This may cause urinary bleeding, burning with urination, and urinary frequency.  Please call our office or present to the ED if you develop fevers >101 or pain which is not able to be controlled with oral pain medications.  You may be given either Flomax and/ or ditropan to help with bladder spasms and stent pain in addition to pain medications.   ° °[facility] °[address], [address] °[place], [place] [address] °[phone] °

## 2016-05-20 NOTE — Progress Notes (Signed)
[facility]  at [facility]   PATIENT NAME: Ashley Mendoza    MR#:  [id number]  DATE OF BIRTH:  11/30/1970  SUBJECTIVE: Patient is seen at bedside, patient had right ureteroscopy with lithotripsy, right ureteral stent placement. Just came back from PACU.   CHIEF COMPLAINT:   Chief Complaint  Patient presents with  . Flank Pain    REVIEW OF SYSTEMS:   ROS dry mouth  CONSTITUTIONAL: No fever, fatigue or weakness.  EYES: No blurred or double vision.  EARS, NOSE, AND THROAT: No tinnitus or ear pain.  RESPIRATORY: No cough, shortness of breath, wheezing or hemoptysis.  CARDIOVASCULAR: No chest pain, orthopnea, edema.  GASTROINTESTINAL: No nausea, vomiting, diarrhea or abdominal pain.  GENITOURINARY: No dysuria, hematuria.  ENDOCRINE: No polyuria, nocturia,  HEMATOLOGY: No anemia, easy bruising or bleeding SKIN: No rash or lesion. MUSCULOSKELETAL: No joint pain or arthritis.   NEUROLOGIC: No tingling, numbness, weakness.  PSYCHIATRY: No anxiety or depression.   DRUG ALLERGIES:  No Known Allergies  VITALS:  Blood pressure 126/76, pulse 70, temperature 98 F (36.7 C), resp. rate 14, height 5\' 1"  (1.549 m), weight 77.1 kg (170 lb), last menstrual period 05/15/2016, SpO2 100 %.  PHYSICAL EXAMINATION:  GENERAL:  46 y.o.-year-old patient lying in the bed with no acute distress.  EYES: Pupils equal, round, reactive to light and accommodation. No scleral icterus. Extraocular muscles intact.  HEENT: Head atraumatic, normocephalic. Oropharynx and nasopharynx clear.  NECK:  Supple, no jugular venous distention. No thyroid enlargement, no tenderness.  LUNGS: Normal breath sounds bilaterally, no wheezing, rales,rhonchi or crepitation. No use of accessory muscles of respiration.  CARDIOVASCULAR: S1, S2 normal. No murmurs, rubs, or gallops.  ABDOMEN: Soft, nontender, nondistended. Bowel sounds present. No organomegaly or mass.  EXTREMITIES: No pedal edema,  cyanosis, or clubbing.  NEUROLOGIC: Cranial nerves II through XII are intact. Muscle strength 5/5 in all extremities. Sensation intact. Gait not checked.  PSYCHIATRIC: The patient is alert and oriented x 3.  SKIN: No obvious rash, lesion, or ulcer.    LABORATORY PANEL:   CBC  Recent Labs Lab 05/20/16 0416  WBC 9.2  HGB 12.3  HCT 35.7  PLT 284   ------------------------------------------------------------------------------------------------------------------  Chemistries   Recent Labs Lab 05/19/16 2018 05/20/16 0416  NA 138 137  K 4.0 3.5  CL 108 112*  CO2 24 20*  GLUCOSE 133* 126*  BUN 10 10  CREATININE 0.63 0.55  CALCIUM 9.0 8.4*  AST 23  --   ALT 14  --   ALKPHOS 53  --   BILITOT 0.4  --    ------------------------------------------------------------------------------------------------------------------  Cardiac Enzymes No results for input(s): TROPONINI in the last 168 hours. ------------------------------------------------------------------------------------------------------------------  RADIOLOGY:  Ct Renal Stone Study  Result Date: 05/19/2016 CLINICAL DATA:  Pt with right flank pain, nausea, emesis for 4 hours. Pt states history of renal calculi in past. Pt denies known fever. EXAM: CT ABDOMEN AND PELVIS WITHOUT CONTRAST TECHNIQUE: Multidetector CT imaging of the abdomen and pelvis was performed following the standard protocol without IV contrast. COMPARISON:  None. FINDINGS: Lower chest: No acute abnormality. Hepatobiliary: No focal liver abnormality is seen. No gallstones, gallbladder wall thickening, or biliary dilatation. Pancreas: Unremarkable. No pancreatic ductal dilatation or surrounding inflammatory changes. Spleen: Normal in size without focal abnormality. Adrenals/Urinary Tract: 10 mm obstructing stone at the right UPJ causing moderate hydronephrosis and perinephric edema. Probable additional 4 mm stone at the UPJ. No left-sided renal stone or  hydronephrosis. Left renal cyst  measuring 2.2 cm. Bladder is partially decompressed. Stomach/Bowel: Bowel is normal in caliber. No bowel wall thickening or evidence of bowel wall inflammation. Appendix is normal. Vascular/Lymphatic: Abdominal aorta is normal in caliber. No enlarged lymph nodes seen. Reproductive: Uterus and bilateral adnexa are unremarkable. Other: No abscess collection.  No free intraperitoneal air. Musculoskeletal: No acute or suspicious osseous finding. Small periumbilical abdominal hernia which contains fat only. IMPRESSION: Obstructing 10 mm stone at the right UPJ causing moderate hydronephrosis and perinephric edema. Additional adjacent 4 mm stone at the right UPJ. Electronically Signed   By: Bary Richard M.D.   On: 05/19/2016 21:43    EKG:   Orders placed or performed during the hospital encounter of 05/19/16  . EKG 12-Lead    ASSESSMENT AND PLAN:    acute right renal colic; with 10 mm stone in right UPJ, status post laser lithotripsy, right ureteral stent placement by urology. Monitor for 24 hours, continue antibiotics, IV fluids, pain medicines, discharge tomorrow.  D/w daughter   All the records are reviewed and case discussed with Care Management/Social Workerr. Management plans discussed with the patient, family and they are in agreement.  CODE STATUS: full  TOTAL TIME TAKING CARE OF THIS PATIENT:35 minutes.   POSSIBLE D/C IN 1 DAYS, DEPENDING ON CLINICAL CONDITION.   Katha Hamming M.D on 05/20/2016 at 1:58 PM  Between 7am to 6pm - Pager - [phone]  After 6pm go to [email or web address] - password [email or web address]  [facility]  Office  [phone]  CC: Primary care physician; Leotis Shames, MD   Note: This dictation was prepared with Dragon dictation along with smaller phrase technology. Any transcriptional errors that result from this process are unintentional.

## 2016-05-20 NOTE — ED Notes (Signed)
Dr. Forde Dandyhugeylmeyer in to see pt.

## 2016-05-20 NOTE — Anesthesia Preprocedure Evaluation (Addendum)
Anesthesia Evaluation  Patient identified by MRN, date of birth, ID band  Reviewed: Allergy & Precautions, NPO status , Patient's Chart, lab work & pertinent test results, reviewed documented beta blocker date and time   Airway Mallampati: III  TM Distance: >3 FB     Dental  (+) Chipped   Pulmonary neg pulmonary ROS,    Pulmonary exam normal        Cardiovascular hypertension, Pt. on medications and Pt. on home beta blockers Normal cardiovascular exam     Neuro/Psych  Headaches, negative psych ROS   GI/Hepatic Neg liver ROS, Chronic abdominal pain   Endo/Other  negative endocrine ROS  Renal/GU stones  negative genitourinary   Musculoskeletal negative musculoskeletal ROS (+)   Abdominal Normal abdominal exam  (+)   Peds negative pediatric ROS (+)  Hematology negative hematology ROS (+)   Anesthesia Other Findings   Reproductive/Obstetrics                            Anesthesia Physical Anesthesia Plan  ASA: II  Anesthesia Plan: General   Post-op Pain Management:    Induction: Intravenous  Airway Management Planned: Oral ETT  Additional Equipment:   Intra-op Plan:   Post-operative Plan: Extubation in OR  Informed Consent: I have reviewed the patients History and Physical, chart, labs and discussed the procedure including the risks, benefits and alternatives for the proposed anesthesia with the patient or authorized representative who has indicated his/her understanding and acceptance.     Plan Discussed with: CRNA and Surgeon  Anesthesia Plan Comments:         Anesthesia Quick Evaluation

## 2016-05-20 NOTE — Transfer of Care (Signed)
Immediate Anesthesia Transfer of Care Note  Patient: Milon Dikesngrid Covell  Procedure(s) Performed: Procedure(s): URETEROSCOPY WITH HOLMIUM LASER LITHOTRIPSY (Right) CYSTOSCOPY WITH STENT PLACEMENT (Right)  Patient Location: PACU  Anesthesia Type:General  Level of Consciousness: sedated  Airway & Oxygen Therapy: Patient Spontanous Breathing and Patient connected to face mask oxygen  Post-op Assessment: Post -op Vital signs reviewed and stable and Post -op Vital signs reviewed and unstable, Anesthesiologist notified  Post vital signs: Reviewed and stable  Last Vitals:  Vitals:   05/20/16 0954 05/20/16 1208  BP: 119/84   Pulse: (!) 54   Resp: 17   Temp: 36.1 C (P) 36.9 C    Last Pain:  Vitals:   05/20/16 1015  TempSrc:   PainSc: 0-No pain         Complications: No apparent anesthesia complications

## 2016-05-20 NOTE — Anesthesia Procedure Notes (Signed)

## 2016-05-20 NOTE — ED Notes (Signed)
Pt transport to 223 

## 2016-05-20 NOTE — Anesthesia Post-op Follow-up Note (Cosign Needed)
Anesthesia QCDR form completed.        

## 2016-05-20 NOTE — Op Note (Signed)
Date of procedure: 05/20/16  Preoperative diagnosis:  1. Right UPJ stone   Postoperative diagnosis:  1. Right UPJ stone   Procedure: 1. Right ureteroscopy 2. Laser lithotripsy 3. Right retrograde pyelogram 4. Basket extraction of Stone fragment 5. Right ureteral stent placement  Surgeon: Vanna Scotland, MD  Anesthesia: General  Complications: None  Intraoperative findings: Large dumbbell shaped 10+ mm stone encountered at the UPJ. This was treated and fragments extracted via basket. Stent placed.  EBL: minimal  Specimens: Stone fragment  Drains: 6 x 22 French double-J ureteral stent on right  Indication: Ashley Mendoza is a 46 y.o. patient with severe right flank pain secondary to 10+ millimeter UPJ stone obstructing.  After reviewing the management options for treatment, she elected to proceed with the above surgical procedure(s). We have discussed the potential benefits and risks of the procedure, side effects of the proposed treatment, the likelihood of the patient achieving the goals of the procedure, and any potential problems that might occur during the procedure or recuperation. Informed consent has been obtained.  Description of procedure:  The patient was taken to the operating room and general anesthesia was induced.  The patient was placed in the dorsal lithotomy position, prepped and draped in the usual sterile fashion, and preoperative antibiotics were administered. A preoperative time-out was performed.   21 [place] scope was advanced per urethra into the bladder. Attention was turned to the right ureteral orifice which was cannulated using a 5 [place] open-ended ureteral catheter. Gentle retrograde pyelogram was performed which revealed mild hydronephrosis to level of the UPJ and a decompressed ureter. There is a filling defect at this level. Additionally, on scout imaging, the stone could be clearly identified as seen on CT scan. A wire was then able to be placed  beyond the stone up to level of the kidney. A second Super Stiff wire was introduced using a similar technique without difficulty. Given the size and location of the stone, I elected to use an access sheath in order to maximally clear of all stone burden. A Cook 12/14 ureteral access sheath was advanced to level of the proximal ureter without difficulty over the Super Stiff wire. The inner cannula was removed. A dual lumen 8 [place] Wolf ureteroscope was then advanced up to level of the renal pelvis with the stone was encountered. Rather than to stones, it was a dumbbell shaped stone. A 273  laser fiber was then brought in and using the settings of 0.8 J and 50 Hz, the stone was fragmented into 15-20 smaller pieces. Each of these pieces was then extracted using a 1.9 [place] to plus nitinol basket. Each and every calyx was strictly visualized to ensure that there was no significant residual stone burden. A final retrograde pyelogram was performed to create a roadmap of the kidney to ensure optimal visualization of each calyx in the renal pelvis. Once no residual stone fragments were identified, the scope was backed down the length of the ureter was carefully inspected for any residual stone fragments or injuries which were not appreciated. A 6 x 22 French double-J ureteral stent was advanced over the safety wire up to level of the renal pelvis. The wire was partially drawn until full coil was noted within the renal pelvis. The wire was then fully withdrawn and full coil was noted within the bladder. The bladder was then drained. She was then cleaned and dried, repositioned the supine position, reversed from anesthesia, taken the PACU in stable condition.  Plan: Patient  will return to the office next week for cystoscopy, stent removal. She may be discharged later today and prescription her provided on her chart.  [name], M.D.

## 2016-05-20 NOTE — Anesthesia Postprocedure Evaluation (Signed)
Anesthesia Post Note  Patient: Milon Dikesngrid Wanat  Procedure(s) Performed: Procedure(s) (LRB): URETEROSCOPY WITH HOLMIUM LASER LITHOTRIPSY (Right) CYSTOSCOPY WITH STENT PLACEMENT (Right)  Patient location during evaluation: PACU Anesthesia Type: General Level of consciousness: awake and alert and oriented Pain management: pain level controlled Vital Signs Assessment: post-procedure vital signs reviewed and stable Respiratory status: spontaneous breathing Cardiovascular status: blood pressure returned to baseline Anesthetic complications: no     Last Vitals:  Vitals:   05/20/16 1238 05/20/16 1253  BP: 119/83 126/76  Pulse: 63 70  Resp: 13 14  Temp:  36.7 C    Last Pain:  Vitals:   05/20/16 1253  TempSrc:   PainSc: 0-No pain                 Aaleyah Witherow

## 2016-05-21 LAB — HIV ANTIBODY (ROUTINE TESTING W REFLEX): HIV Screen 4th Generation wRfx: NONREACTIVE

## 2016-05-21 NOTE — Progress Notes (Signed)
Alert and oriented. Vital signs stable . No signs of acute distress. Discharge instructions given. Patient verbalized understanding. No other issues noted at this time.    

## 2016-05-21 NOTE — Progress Notes (Signed)
     Ashley Mendoza was admitted to the Hospital on 05/19/2016 and Discharged  05/21/2016 and should be excused from work/school   for 6  days starting 05/19/2016 , may return to work/school without any restrictions.    Katha HammingKONIDENA,Serenah Mill M.D on 05/21/2016,at 9:24 AM

## 2016-05-23 NOTE — Discharge Summary (Signed)
Ashley Mendoza, is a 46 y.o. female  DOB 1970-12-18  MRN [id number].  Admission date:  05/19/2016  Admitting Physician  Tonye Royalty, DO  Discharge Date:  05/21/2016   Primary MD  Leotis Shames, MD  Recommendations for primary care physician for things to follow:   Follow-up with Dr. Vanna Scotland in 1 week cystoscopy, stent removal.    Admission Diagnosis  Kidney stone [N20.0] Right lower quadrant abdominal pain [R10.31]   Discharge Diagnosis  Kidney stone [N20.0] Right lower quadrant abdominal pain [R10.31]    Active Problems:   Renal colic      Past Medical History:  Diagnosis Date  . Chronic abdominal pain   . Hypertension   . Migraine     Past Surgical History:  Procedure Laterality Date  . CYSTOSCOPY WITH STENT PLACEMENT Right 05/20/2016   Procedure: CYSTOSCOPY WITH STENT PLACEMENT;  Surgeon: Vanna Scotland, MD;  Location: [facility] ORS;  Service: Urology;  Laterality: Right;  . HEMORROIDECTOMY    . TUBAL LIGATION    . URETEROSCOPY WITH HOLMIUM LASER LITHOTRIPSY Right 05/20/2016   Procedure: URETEROSCOPY WITH HOLMIUM LASER LITHOTRIPSY;  Surgeon: Vanna Scotland, MD;  Location: [facility] ORS;  Service: Urology;  Laterality: Right;       History of present illness and  Hospital Course:     Kindly see H&P for history of present illness and admission details, please review complete Labs, Consult reports and Test reports for all details in brief  HPI  from the history and physical done on the day of admission 46 year old female patient admitted because of severe right flank pain, nausea, vomiting, chills, found to have done millimeters obstructing stone at the right UPJ with moderate hydronephrosis, perinephric edema.   Hospital Course    1. Acute right flank pain secondary to ureteric colic: Seen by urology,  patient received antibiotics IV, taken to operating room for right ureteroscopy, laser lithotripsy, right ureteral stent placement on March 5, patient tolerated the procedure well. Monitored overnight, patient did not have any fever, discharge home with Flomax, Narco, oxybutynin. Follow up with urology as an outpatient in 1 week. Patient remained asymptomatic at discharge. Discussed the plan with patient, patient's daughter. Essential hypertension: Controlled.continue propranolol,.    Discharge Condition:    Follow UP  Follow-up Information    Vanna Scotland, MD. Go on 05/28/2016.   Specialty:  Urology Why:  Tuesday at 1:45pm For cystoscopy/ stent removal Contact information: [address] [place] [address] [phone]             Discharge Instructions  and  Discharge Medications      Allergies as of 05/21/2016   No Known Allergies     Medication List    TAKE these medications   baclofen 10 MG tablet Commonly known as:  LIORESAL Take 10 mg by mouth 2 (two) times daily.   dicyclomine 10 MG capsule Commonly known as:  BENTYL Take 10 mg by mouth 3 (three) times daily before meals.   HYDROcodone-acetaminophen 5-325 MG tablet Commonly known as:  NORCO/VICODIN Take 1-2 tablets by mouth every 6 (six) hours as needed for moderate pain.   oxybutynin 5 MG tablet Commonly known as:  DITROPAN Take 1 tablet (5 mg total) by mouth every 8 (eight) hours as needed for bladder spasms.   pantoprazole 20 MG tablet Commonly known as:  PROTONIX Take 20 mg by mouth 2 (two) times daily.   propranolol 40 MG tablet Commonly known as:  INDERAL Take 40 mg  by mouth 2 (two) times daily.   QUDEXY XR 100 MG Cs24 Generic drug:  Topiramate ER Take 1 capsule by mouth daily.   tamsulosin 0.4 MG Caps capsule Commonly known as:  FLOMAX Take 1 capsule (0.4 mg total) by mouth daily.         Diet and Activity recommendation: See Discharge Instructions  above   Consults obtained -urology   Major procedures and Radiology Reports - PLEASE review detailed and final reports for all details, in brief -      Ct Renal Stone Study  Result Date: 05/19/2016 CLINICAL DATA:  Pt with right flank pain, nausea, emesis for 4 hours. Pt states history of renal calculi in past. Pt denies known fever. EXAM: CT ABDOMEN AND PELVIS WITHOUT CONTRAST TECHNIQUE: Multidetector CT imaging of the abdomen and pelvis was performed following the standard protocol without IV contrast. COMPARISON:  None. FINDINGS: Lower chest: No acute abnormality. Hepatobiliary: No focal liver abnormality is seen. No gallstones, gallbladder wall thickening, or biliary dilatation. Pancreas: Unremarkable. No pancreatic ductal dilatation or surrounding inflammatory changes. Spleen: Normal in size without focal abnormality. Adrenals/Urinary Tract: 10 mm obstructing stone at the right UPJ causing moderate hydronephrosis and perinephric edema. Probable additional 4 mm stone at the UPJ. No left-sided renal stone or hydronephrosis. Left renal cyst measuring 2.2 cm. Bladder is partially decompressed. Stomach/Bowel: Bowel is normal in caliber. No bowel wall thickening or evidence of bowel wall inflammation. Appendix is normal. Vascular/Lymphatic: Abdominal aorta is normal in caliber. No enlarged lymph nodes seen. Reproductive: Uterus and bilateral adnexa are unremarkable. Other: No abscess collection.  No free intraperitoneal air. Musculoskeletal: No acute or suspicious osseous finding. Small periumbilical abdominal hernia which contains fat only. IMPRESSION: Obstructing 10 mm stone at the right UPJ causing moderate hydronephrosis and perinephric edema. Additional adjacent 4 mm stone at the right UPJ. Electronically Signed   By: Bary Richard M.D.   On: 05/19/2016 21:43    Micro Results    No results found for this or any previous visit (from the past 240 hour(s)).     Today   Subjective:   Ashley Mendoza today has no headache,no chest abdominal pain,no new weakness tingling or numbness, feels much better wants to go home today.   Objective:   Blood pressure 119/73, pulse 72, temperature 98.7 F (37.1 C), temperature source Oral, resp. rate 17, height 5\' 1"  (1.549 m), weight 77.1 kg (170 lb), last menstrual period 05/15/2016, SpO2 100 %.  No intake or output data in the 24 hours ending 05/23/16 1415  Exam Awake Alert, Oriented x 3, No new F.N deficits, Normal affect .AT,PERRAL Supple Neck,No JVD, No cervical lymphadenopathy appriciated.  Symmetrical Chest wall movement, Good air movement bilaterally, CTAB RRR,No Gallops,Rubs or new Murmurs, No Parasternal Heave +ve B.Sounds, Abd Soft, Non tender, No organomegaly appriciated, No rebound -guarding or rigidity. No Cyanosis, Clubbing or edema, No new Rash or bruise  Data Review   CBC w Diff:  Lab Results  Component Value Date   WBC 9.2 05/20/2016   HGB 12.3 05/20/2016   HCT 35.7 05/20/2016   PLT 284 05/20/2016   LYMPHOPCT 16 05/19/2016   MONOPCT 4 05/19/2016   EOSPCT 0 05/19/2016   BASOPCT 0 05/19/2016    CMP:  Lab Results  Component Value Date   NA 137 05/20/2016   K 3.5 05/20/2016   CL 112 (H) 05/20/2016   CO2 20 (L) 05/20/2016   BUN 10 05/20/2016   CREATININE 0.55 05/20/2016  PROT 7.5 05/19/2016   ALBUMIN 4.1 05/19/2016   BILITOT 0.4 05/19/2016   ALKPHOS 53 05/19/2016   AST 23 05/19/2016   ALT 14 05/19/2016  .   Total Time in preparing paper work, data evaluation and todays exam - 35 minutes  Maddi Collar M.D on 3/62018 at 2:15 PM    Note: This dictation was prepared with Dragon dictation along with smaller phrase technology. Any transcriptional errors that result from this process are unintentional.

## 2016-05-28 ENCOUNTER — Ambulatory Visit (INDEPENDENT_AMBULATORY_CARE_PROVIDER_SITE_OTHER): Payer: 59 | Admitting: Urology

## 2016-05-28 ENCOUNTER — Encounter: Payer: Self-pay | Admitting: Urology

## 2016-05-28 VITALS — BP 122/85 | HR 85 | Ht 61.0 in | Wt 169.0 lb

## 2016-05-28 DIAGNOSIS — N200 Calculus of kidney: Secondary | ICD-10-CM

## 2016-05-28 LAB — URINALYSIS, COMPLETE
Bilirubin, UA: NEGATIVE
Glucose, UA: NEGATIVE
NITRITE UA: POSITIVE — AB
Urobilinogen, Ur: 0.2 mg/dL (ref 0.2–1.0)
pH, UA: 6 (ref 5.0–7.5)

## 2016-05-28 LAB — MICROSCOPIC EXAMINATION

## 2016-05-28 MED ORDER — SULFAMETHOXAZOLE-TRIMETHOPRIM 800-160 MG PO TABS: 1.0000 | ORAL_TABLET | Freq: Two times a day (BID) | ORAL | 0 refills | Status: AC

## 2016-05-28 MED ORDER — CIPROFLOXACIN HCL 500 MG PO TABS
500.0000 mg | ORAL_TABLET | Freq: Once | ORAL | Status: AC
  Administered 2016-05-28: 500 mg via ORAL

## 2016-05-28 MED ORDER — LIDOCAINE HCL 2 % EX GEL: 1.0000 "application " | Freq: Once | CUTANEOUS | Status: DC

## 2016-05-28 NOTE — Progress Notes (Addendum)
Patient presented to the office today for cystoscopy, stent removal. She is having urinary symptoms including frequency and dysuria. UA today is highly suspicious for infection, nitrate positive with 6-10 white blood cells. Many bacteria were seen.  As such, we'll go ahead and start antibiotics, send urine culture today, and plan for cystoscopy, stent removal on Thursday.  She agreeable with this plan. She is requesting a work note which was provided.  Vanna ScotlandAshley Chandel Zaun, MD

## 2016-05-28 NOTE — Addendum Note (Signed)
Addended by: Vanna ScotlandBRANDON, Tyheim Vanalstyne on: 05/28/2016 04:43 PM   Modules accepted: Orders

## 2016-05-29 LAB — STONE ANALYSIS
Ca Oxalate,Dihydrate: 15 %
Ca Oxalate,Monohydr.: 65 %
Ca phos cry stone ql IR: 20 %
STONE WEIGHT KSTONE: 169.4 mg

## 2016-05-30 ENCOUNTER — Ambulatory Visit (INDEPENDENT_AMBULATORY_CARE_PROVIDER_SITE_OTHER): Payer: 59 | Admitting: Urology

## 2016-05-30 ENCOUNTER — Encounter: Payer: Self-pay | Admitting: Urology

## 2016-05-30 VITALS — BP 129/82 | HR 89 | Wt 169.0 lb

## 2016-05-30 DIAGNOSIS — Z87442 Personal history of urinary calculi: Secondary | ICD-10-CM | POA: Diagnosis not present

## 2016-05-30 NOTE — Progress Notes (Signed)
.  bua

## 2016-05-30 NOTE — Progress Notes (Signed)
   05/30/16  CC:  Chief Complaint  Patient presents with  . Cysto Stent Removal    HPI: 46 year old female who returns today for cystoscopy, right-sided stent removal following right ureteroscopy for a right UPJ stone on 05/20/2016. She presented earlier in the week and her UA was frankly positive with nitrates. She is been on antibiotics since that time. She is otherwise asymptomatic. No fevers or chills.  Blood pressure 129/82, pulse 89, weight 169 lb (76.7 kg), last menstrual period 05/15/2016. NED. A&Ox3.   No respiratory distress   Abd soft, NT, ND Normal external genitalia with patent urethral meatus  Cystoscopy/ Stent removal procedure  Patient identification was confirmed, informed consent was obtained, and patient was prepped using Betadine solution.  Lidocaine jelly was administered per urethral meatus.    Preoperative abx where received prior to procedure.    Procedure: - Flexible cystoscope introduced, without any difficulty.   - Thorough search of the bladder revealed:    normal urethral meatus  Stent seen emanating from right ureteral orifice, grasped with stent graspers, and removed in entirety.    Post-Procedure: - Patient tolerated the procedure well   Assessment/ Plan:  1. History of kidney stones s/p uncomplicated stent removal Complete antibiotics, will adjust as needed based on culture data Warning symptoms are reviewed carefully and indications for urgent/emergent intervention Will follow-up in 4 weeks with a renal ultrasound - US Renal; Future  Return in about 4 weeks (around 06/27/2016) for f/u RUS.   Vanna ScotlandAshley Heidy Mccubbin, MD

## 2016-05-31 LAB — CULTURE, URINE COMPREHENSIVE

## 2016-07-12 ENCOUNTER — Ambulatory Visit: Payer: 59 | Admitting: Urology

## 2016-08-27 ENCOUNTER — Other Ambulatory Visit: Payer: Self-pay | Admitting: Internal Medicine

## 2016-08-27 DIAGNOSIS — Z1239 Encounter for other screening for malignant neoplasm of breast: Secondary | ICD-10-CM

## 2018-05-07 IMAGING — CT CT RENAL STONE PROTOCOL
2 of 3 series · 9 of 46 positions shown, 11 images · non-contrast
Comparison: None.

CLINICAL DATA: Pt with right flank pain, nausea, emesis for 4
hours. Pt states history of renal calculi in past. Pt denies known
fever.

EXAM:
CT ABDOMEN AND PELVIS WITHOUT CONTRAST
TECHNIQUE: Multidetector CT imaging of the abdomen and pelvis was performed
following the standard protocol without IV contrast.

[Series 5: coronal · coronal · 0.77mm/px · 8 of 130 slices shown, 9 images]
[im 15/130  soft-tissue]
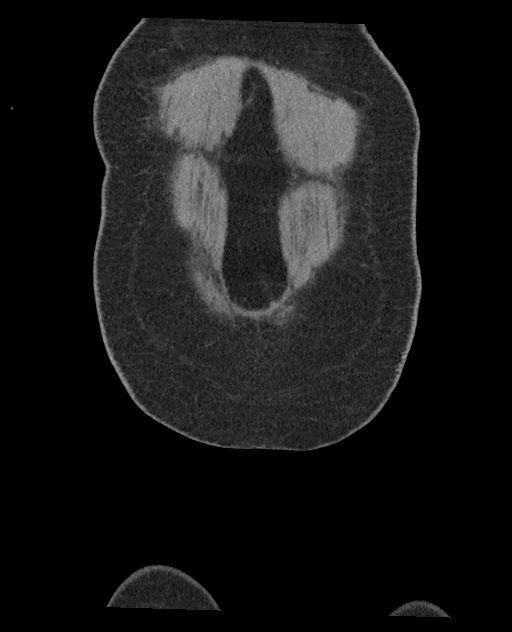
[im 15/130  bone]
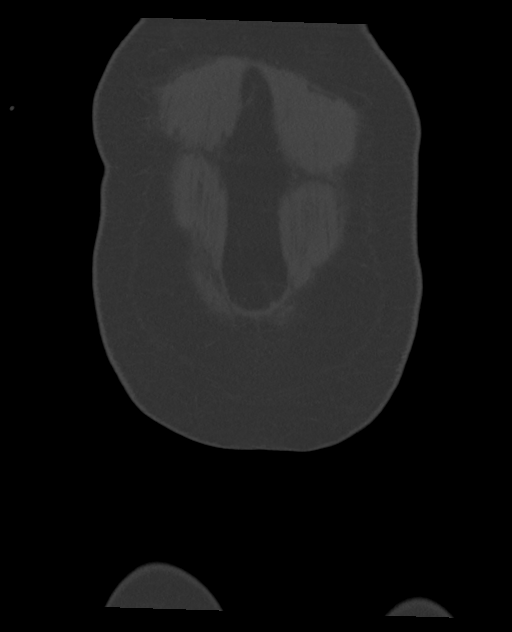
[im 29/130  soft-tissue]
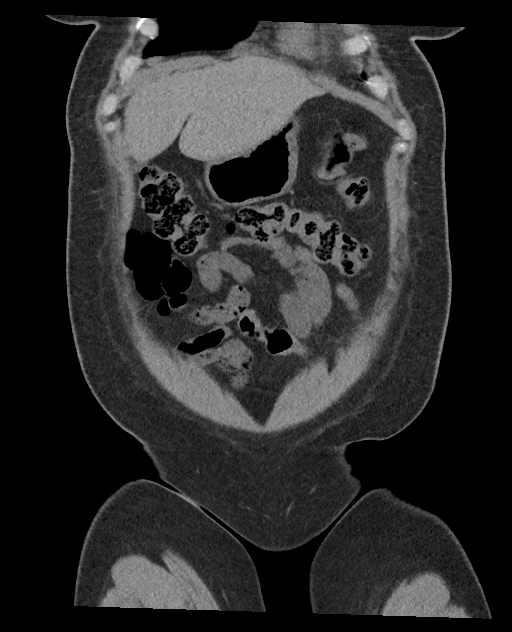
[im 44/130  soft-tissue]
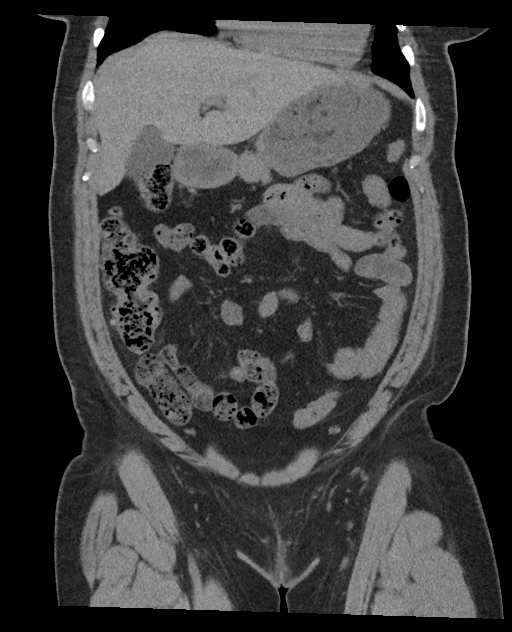
[im 58/130  soft-tissue]
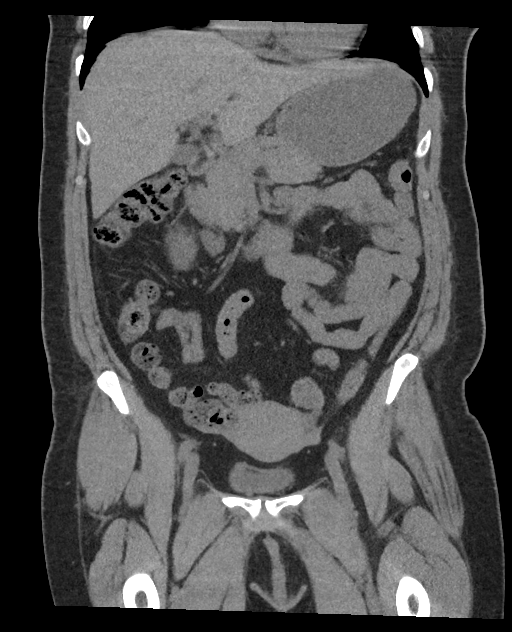
[im 72/130  soft-tissue]
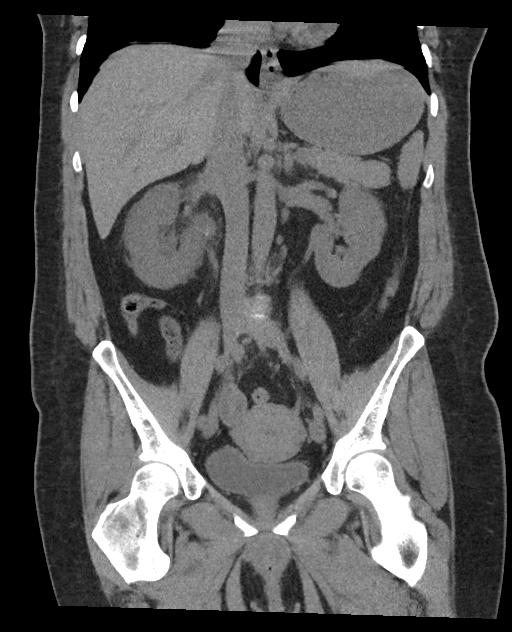
[im 87/130  soft-tissue]
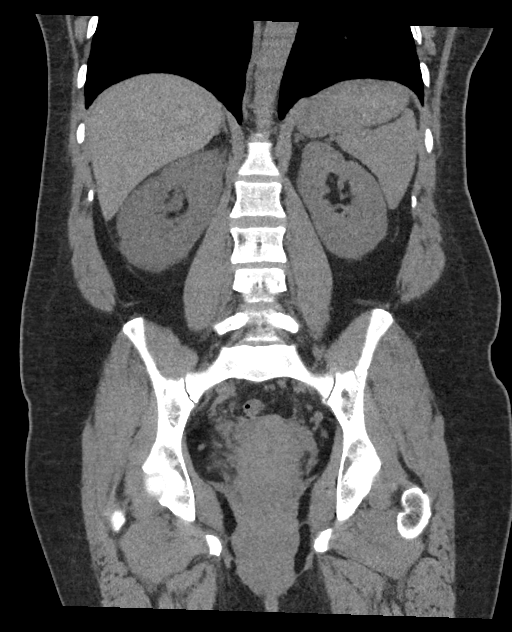
[im 101/130  soft-tissue]
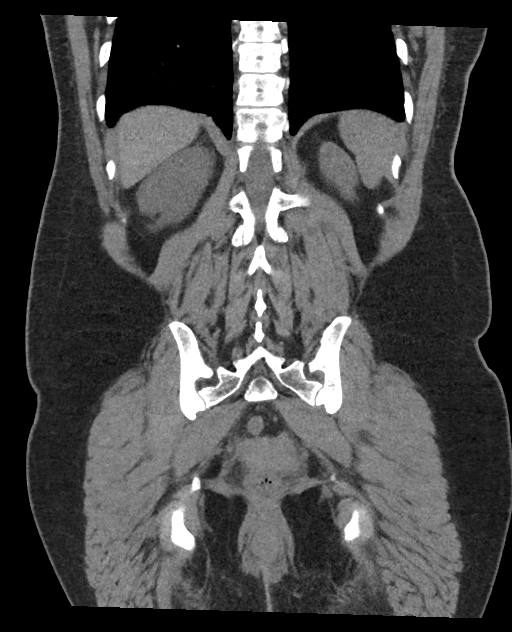
[im 115/130  soft-tissue]
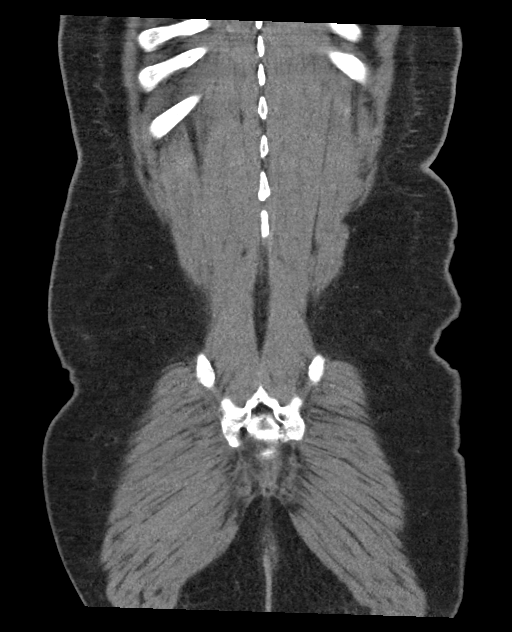

[Series 6: sagittal · sagittal · 0.65mm/px · 1 of 165 slices shown, 2 images]
[im 55/165  soft-tissue]
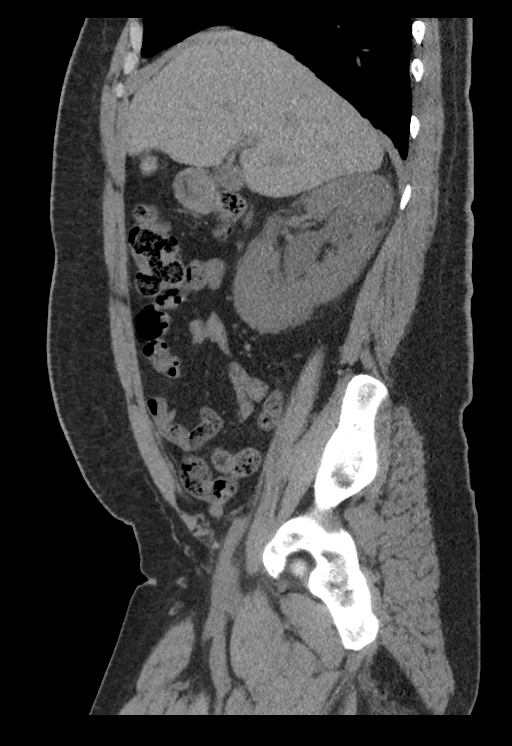
[im 55/165  bone]
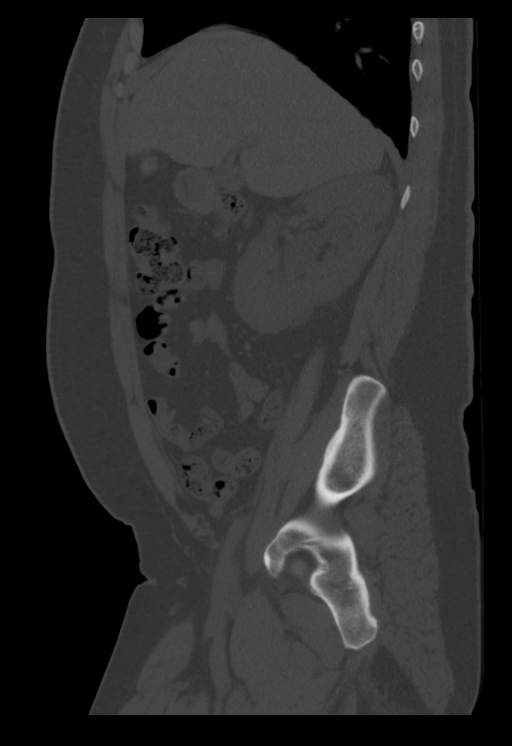

[9 of 46 positions shown; findings below may reference images not displayed]

FINDINGS: Lower chest: No acute abnormality.

Hepatobiliary: No focal liver abnormality is seen. No gallstones,
gallbladder wall thickening, or biliary dilatation.

Pancreas: Unremarkable. No pancreatic ductal dilatation or
surrounding inflammatory changes.

Spleen: Normal in size without focal abnormality.

Adrenals/Urinary Tract: 10 mm obstructing stone at the right UPJ
causing moderate hydronephrosis and perinephric edema. Probable
additional 4 mm stone at the UPJ.

No left-sided renal stone or hydronephrosis. Left renal cyst
measuring 2.2 cm. Bladder is partially decompressed.

Stomach/Bowel: Bowel is normal in caliber. No bowel wall thickening
or evidence of bowel wall inflammation. Appendix is normal.

Vascular/Lymphatic: Abdominal aorta is normal in caliber. No
enlarged lymph nodes seen.

Reproductive: Uterus and bilateral adnexa are unremarkable.

Other: No abscess collection.  No free intraperitoneal air.

Musculoskeletal: No acute or suspicious osseous finding. Small
periumbilical abdominal hernia which contains fat only.
IMPRESSION: Obstructing 10 mm stone at the right UPJ causing moderate
hydronephrosis and perinephric edema. Additional adjacent 4 mm stone
at the right UPJ.

## 2018-11-04 IMAGING — US US ABDOMEN LIMITED
1 series · 14 of 25 positions shown · non-contrast
Comparison: None.

CLINICAL DATA: Right upper quadrant pain

EXAM:
US ABDOMEN LIMITED - RIGHT UPPER QUADRANT

[Series 1: us abdomen limited · 0.23mm/px · 14 of 54 slices shown]
[im 1/54]
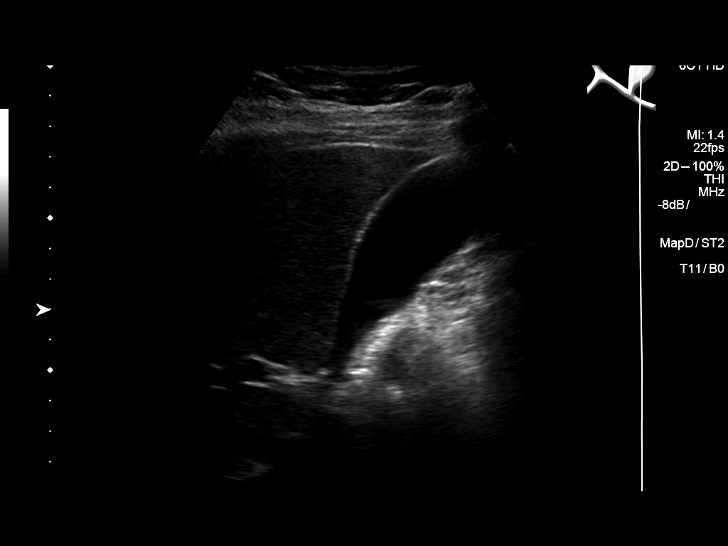
[im 5/54]
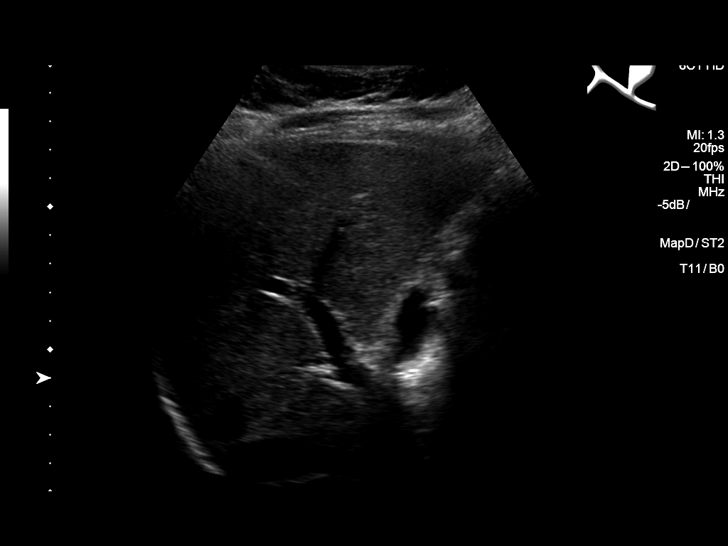
[im 9/54]
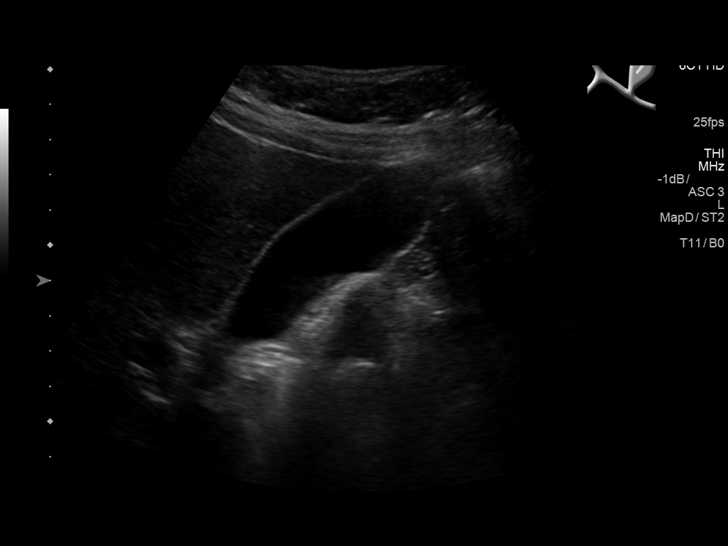
[im 14/54]
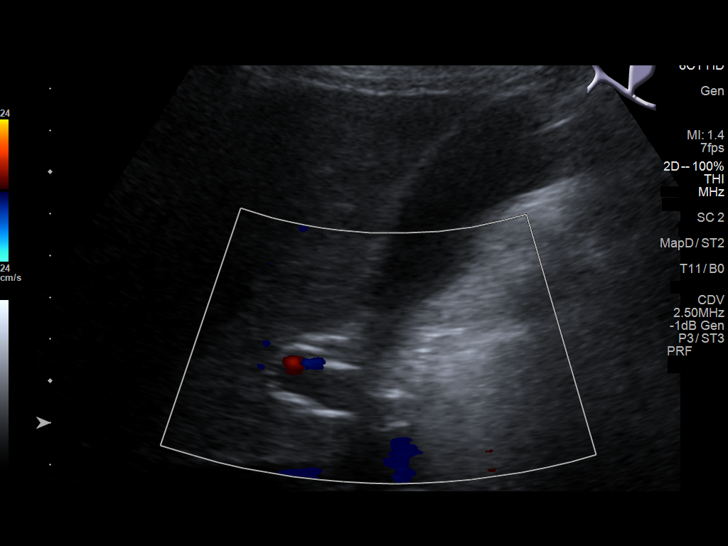
[im 18/54]
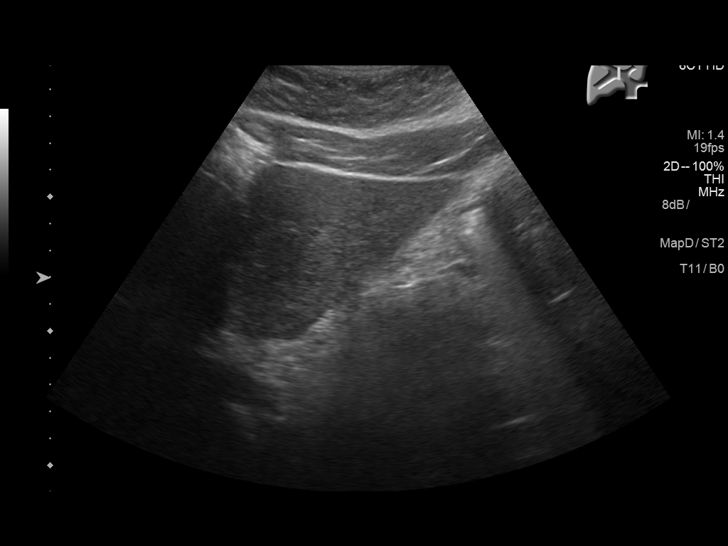
[im 20/54]
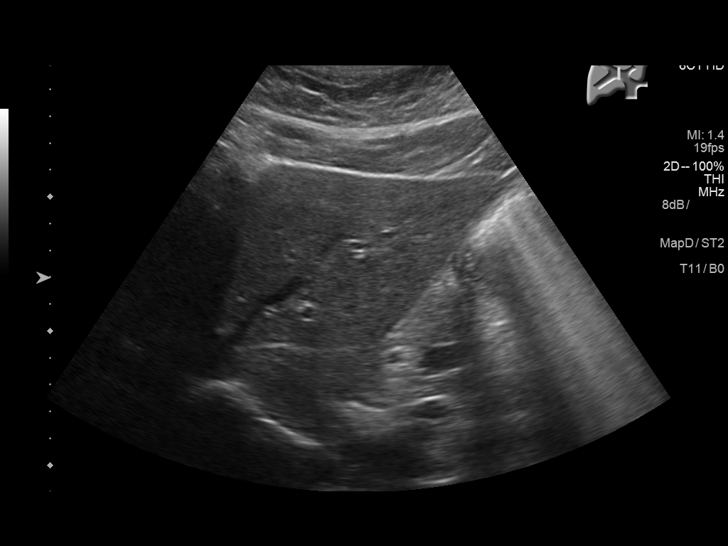
[im 25/54]
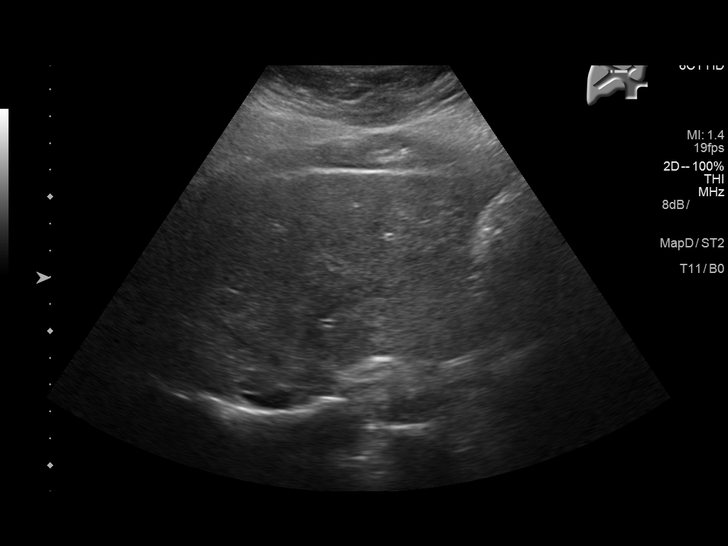
[im 29/54]
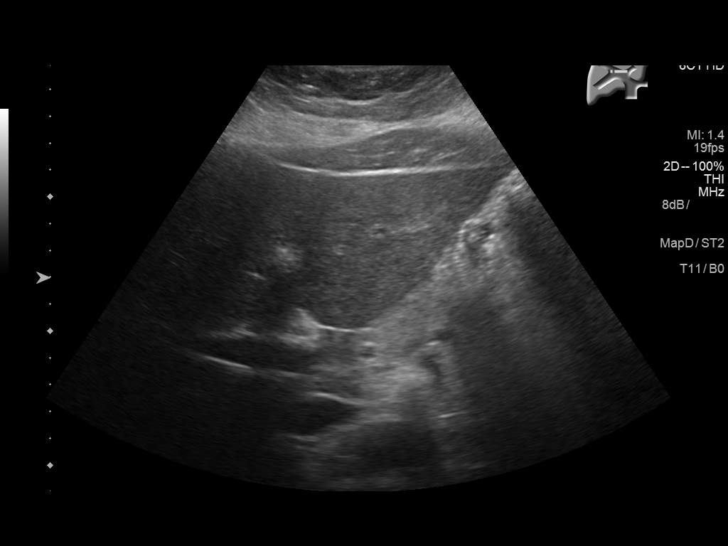
[im 34/54]
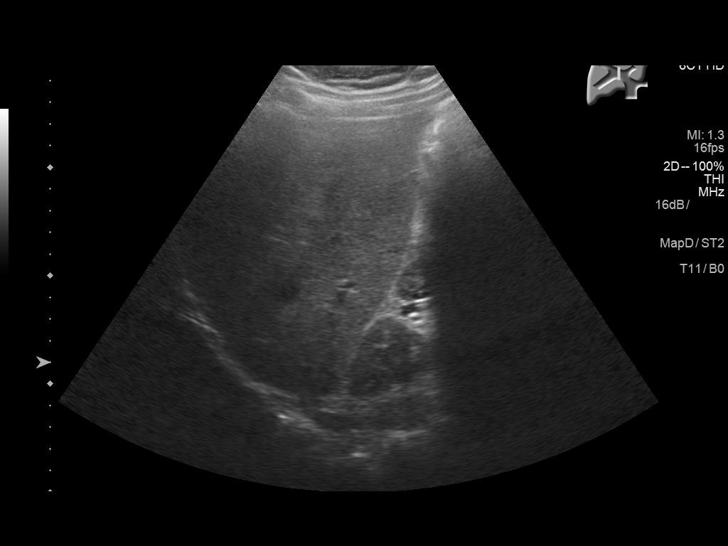
[im 36/54]
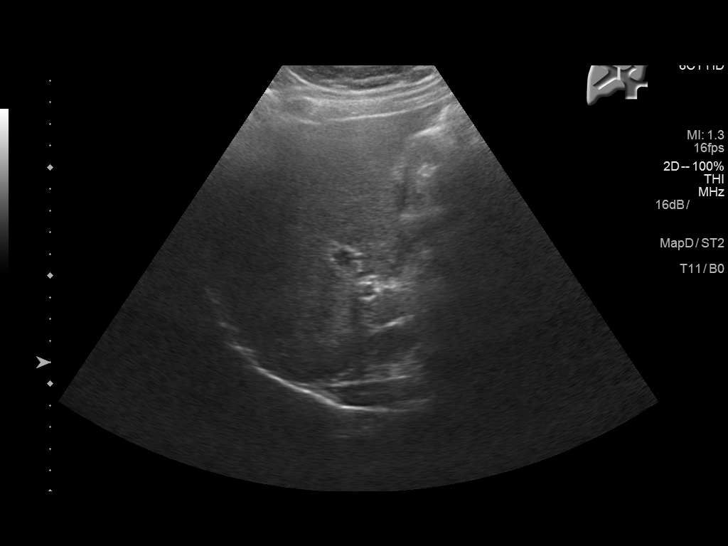
[im 40/54]
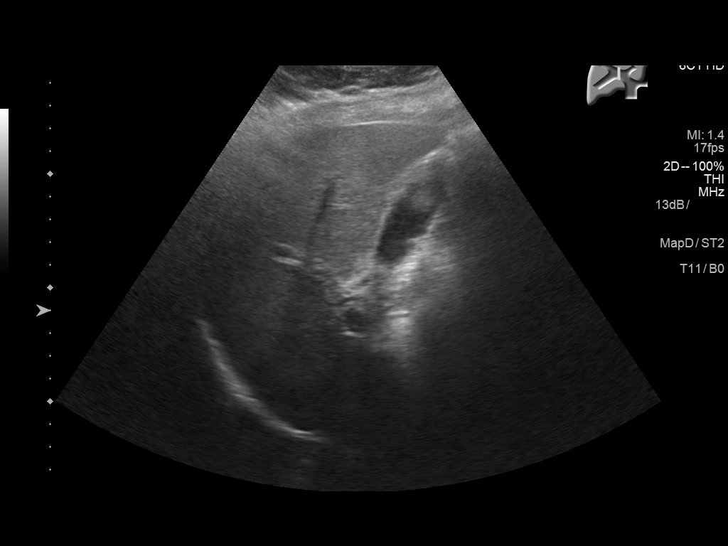
[im 45/54]
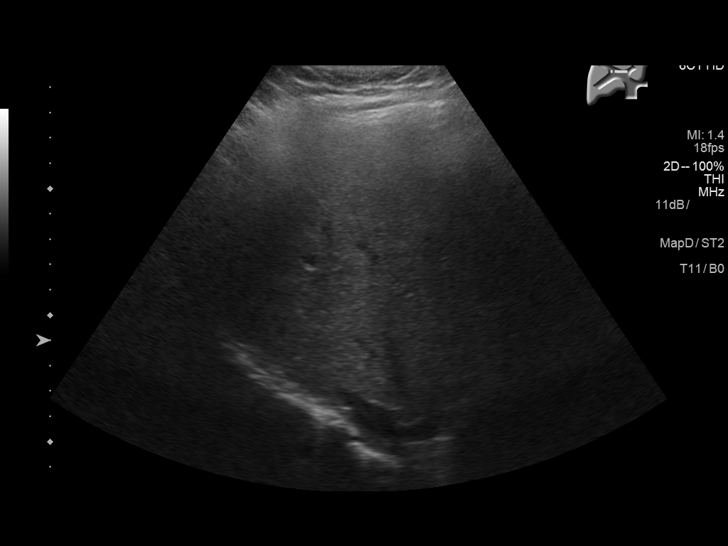
[im 49/54]
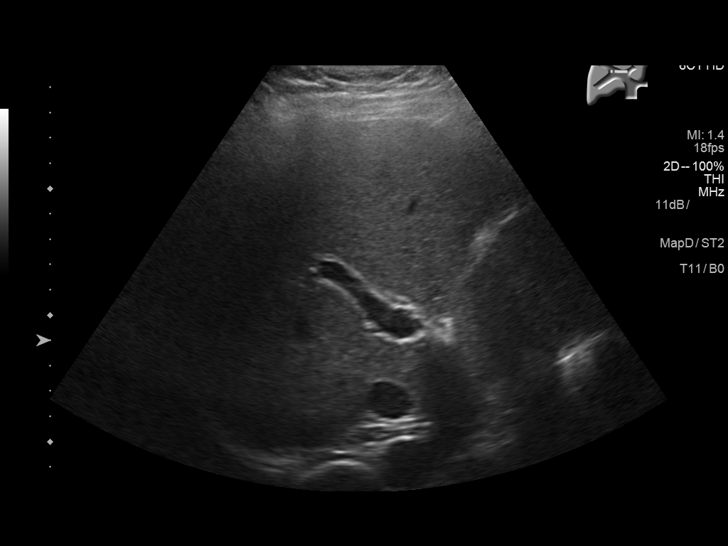
[im 54/54]
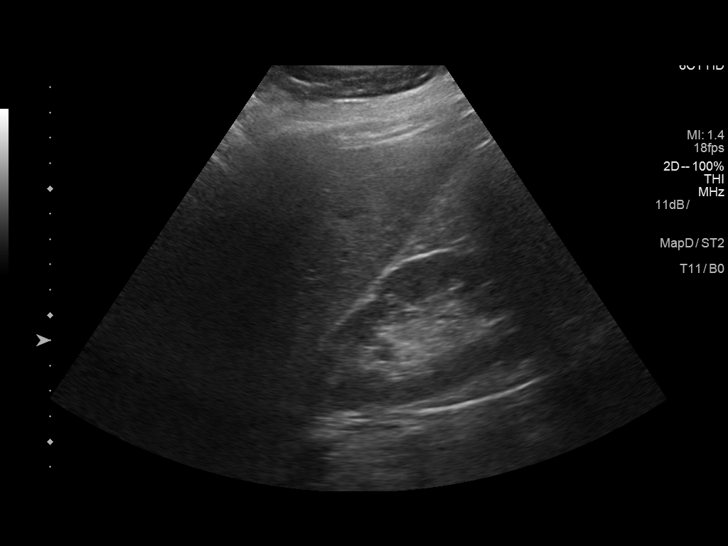

[14 of 25 positions shown; findings below may reference images not displayed]

FINDINGS: Gallbladder:

No gallstones or wall thickening visualized. No sonographic Murphy
sign noted by sonographer.

Common bile duct:

Diameter: 2 mm in diameter within normal limits.

Liver:

No focal lesion identified. Within normal limits in parenchymal
echogenicity.
IMPRESSION: Normal right upper quadrant ultrasound

## 2020-07-19 ENCOUNTER — Other Ambulatory Visit: Payer: Self-pay | Admitting: Internal Medicine

## 2020-07-19 DIAGNOSIS — Z1231 Encounter for screening mammogram for malignant neoplasm of breast: Secondary | ICD-10-CM

## 2020-08-07 ENCOUNTER — Other Ambulatory Visit: Payer: Self-pay

## 2020-08-07 ENCOUNTER — Ambulatory Visit
Admission: RE | Admit: 2020-08-07 | Discharge: 2020-08-07 | Disposition: A | Discharge status: Home / Self Care | Payer: Self-pay | Source: Ambulatory Visit | Attending: Internal Medicine | Admitting: Internal Medicine

## 2020-08-07 DIAGNOSIS — Z1231 Encounter for screening mammogram for malignant neoplasm of breast: Secondary | ICD-10-CM | POA: Insufficient documentation

## 2022-05-18 ENCOUNTER — Other Ambulatory Visit: Payer: Self-pay

## 2022-05-18 ENCOUNTER — Encounter (HOSPITAL_COMMUNITY): Payer: Self-pay | Admitting: *Deleted

## 2022-05-18 ENCOUNTER — Emergency Department (HOSPITAL_COMMUNITY)
Admission: EM | Admit: 2022-05-18 | Discharge: 2022-05-19 | Disposition: A | Discharge status: Home / Self Care | Payer: Self-pay | Attending: Emergency Medicine | Admitting: Emergency Medicine

## 2022-05-18 DIAGNOSIS — K529 Noninfective gastroenteritis and colitis, unspecified: Secondary | ICD-10-CM | POA: Insufficient documentation

## 2022-05-18 DIAGNOSIS — R1084 Generalized abdominal pain: Secondary | ICD-10-CM

## 2022-05-18 DIAGNOSIS — Z79899 Other long term (current) drug therapy: Secondary | ICD-10-CM | POA: Insufficient documentation

## 2022-05-18 DIAGNOSIS — I10 Essential (primary) hypertension: Secondary | ICD-10-CM | POA: Insufficient documentation

## 2022-05-18 LAB — URINALYSIS, ROUTINE W REFLEX MICROSCOPIC
Bilirubin Urine: NEGATIVE
Glucose, UA: NEGATIVE mg/dL
Hgb urine dipstick: NEGATIVE
Ketones, ur: NEGATIVE mg/dL
Leukocytes,Ua: NEGATIVE
Nitrite: NEGATIVE
Protein, ur: NEGATIVE mg/dL
Specific Gravity, Urine: 1.012 (ref 1.005–1.030)
pH: 7 (ref 5.0–8.0)

## 2022-05-18 NOTE — ED Triage Notes (Signed)
The pt has had abd pain n v and diarrhea for 4 days  no temp   lmp last  month

## 2022-05-19 ENCOUNTER — Emergency Department (HOSPITAL_COMMUNITY): Payer: Self-pay

## 2022-05-19 LAB — COMPREHENSIVE METABOLIC PANEL
ALT: 15 U/L (ref 0–44)
AST: 20 U/L (ref 15–41)
Albumin: 3.5 g/dL (ref 3.5–5.0)
Alkaline Phosphatase: 65 U/L (ref 38–126)
Anion gap: 14 (ref 5–15)
BUN: 17 mg/dL (ref 6–20)
CO2: 21 mmol/L — ABNORMAL LOW (ref 22–32)
Calcium: 9.8 mg/dL (ref 8.9–10.3)
Chloride: 103 mmol/L (ref 98–111)
Creatinine, Ser: 0.82 mg/dL (ref 0.44–1.00)
GFR, Estimated: >60 mL/min (ref >60)
Glucose, Bld: 97 mg/dL (ref 70–99)
Potassium: 3.7 mmol/L (ref 3.5–5.1)
Sodium: 138 mmol/L (ref 135–145)
Total Bilirubin: 0.4 mg/dL (ref 0.3–1.2)
Total Protein: 7 g/dL (ref 6.5–8.1)

## 2022-05-19 LAB — C DIFFICILE QUICK SCREEN W PCR REFLEX
C Diff antigen: NEGATIVE
C Diff interpretation: NOT DETECTED
C Diff toxin: NEGATIVE

## 2022-05-19 LAB — CBC
HCT: 37.3 % (ref 36.0–46.0)
Hemoglobin: 12.3 g/dL (ref 12.0–15.0)
MCH: 29.5 pg (ref 26.0–34.0)
MCHC: 33 g/dL (ref 30.0–36.0)
MCV: 89.4 fL (ref 80.0–100.0)
Platelets: 292 10*3/uL (ref 150–400)
RBC: 4.17 MIL/uL (ref 3.87–5.11)
RDW: 13 % (ref 11.5–15.5)
WBC: 5.2 10*3/uL (ref 4.0–10.5)
nRBC: 0 % (ref 0.0–0.2)

## 2022-05-19 LAB — LIPASE, BLOOD: Lipase: 43 U/L (ref 11–51)

## 2022-05-19 LAB — I-STAT BETA HCG BLOOD, ED (MC, WL, AP ONLY): I-stat hCG, quantitative: <5 m[IU]/mL (ref <5)

## 2022-05-19 MED ORDER — ONDANSETRON HCL 4 MG/2ML IJ SOLN
4.0000 mg | Freq: Once | INTRAMUSCULAR | Status: AC
  Administered 2022-05-19: 4 mg via INTRAVENOUS
  Filled 2022-05-19: qty 2

## 2022-05-19 MED ORDER — METOCLOPRAMIDE HCL 5 MG/ML IJ SOLN
10.0000 mg | Freq: Once | INTRAMUSCULAR | Status: AC
  Administered 2022-05-19: 10 mg via INTRAVENOUS
  Filled 2022-05-19: qty 2

## 2022-05-19 MED ORDER — DICYCLOMINE HCL 10 MG PO CAPS
10.0000 mg | ORAL_CAPSULE | Freq: Once | ORAL | Status: AC
  Administered 2022-05-19: 10 mg via ORAL
  Filled 2022-05-19: qty 1

## 2022-05-19 MED ORDER — DICYCLOMINE HCL 20 MG PO TABS: 20.0000 mg | ORAL_TABLET | Freq: Two times a day (BID) | ORAL | 0 refills | Status: AC | PRN

## 2022-05-19 MED ORDER — IOHEXOL 350 MG/ML SOLN
75.0000 mL | Freq: Once | INTRAVENOUS | Status: AC | PRN
  Administered 2022-05-19: 75 mL via INTRAVENOUS

## 2022-05-19 MED ORDER — SODIUM CHLORIDE 0.9 % IV BOLUS
1000.0000 mL | Freq: Once | INTRAVENOUS | Status: AC
  Administered 2022-05-19: 1000 mL via INTRAVENOUS

## 2022-05-19 NOTE — ED Provider Notes (Signed)
[facility] Provider Note   CSN: [id number] Arrival date & time: 05/18/22  2230     History  Chief Complaint  Patient presents with   Abdominal Pain    Ashley Mendoza is a 52 y.o. female.  The history is provided by the patient and medical records. Language interpreter used: daughter acted as interpreter at bedside.  Abdominal Pain Ashley Mendoza is a 52 y.o. female who presents to the Emergency Department complaining of abdominal pain.  She presents to the emergency department accompanied by her daughter for evaluation of abdominal pain that has been present for the last 4 days.  Pain is located between her umbilicus and upper abdomen and is described as cramping and intense in nature.  She has had 2 episodes of emesis with persistent significant nausea.  Her pain radiates to her back.  She reports numerous watery episodes of diarrhea as well.  She only has a small amount of blood when she wipes.  No fever but she does feel hot.  No dysuria. She has a history of hypertension, migraine.  No prior abdominal surgeries.  No known sick contacts or bad food exposures.  She did return from [place] 3 weeks ago but did not have any illness while she was there.  Feels hot, no fever        Home Medications Prior to Admission medications   Medication Sig Start Date End Date Taking? Authorizing Provider  dicyclomine (BENTYL) 20 MG tablet Take 1 tablet (20 mg total) by mouth 2 (two) times daily as needed for spasms. 05/19/22  Yes Quintella Reichert, MD  baclofen (LIORESAL) 10 MG tablet Take 10 mg by mouth 2 (two) times daily.    [provider]  HYDROcodone-acetaminophen (NORCO/VICODIN) 5-325 MG tablet Take 1-2 tablets by mouth every 6 (six) hours as needed for moderate pain. 05/20/16   Hollice Espy, MD  oxybutynin (DITROPAN) 5 MG tablet Take 1 tablet (5 mg total) by mouth every 8 (eight) hours as needed for bladder spasms. 05/20/16   Hollice Espy, MD  pantoprazole (PROTONIX) 20 MG tablet Take 20 mg by mouth 2 (two) times daily.    [provider]  propranolol (INDERAL) 40 MG tablet Take 40 mg by mouth 2 (two) times daily. 02/16/16   [provider]  QUDEXY XR 100 MG CS24 Take 1 capsule by mouth daily. 04/10/16   [provider]  sulfamethoxazole-trimethoprim (BACTRIM DS,SEPTRA DS) 800-160 MG tablet Take 1 tablet by mouth every 12 (twelve) hours. 05/28/16   Hollice Espy, MD  tamsulosin (FLOMAX) 0.4 MG CAPS capsule Take 1 capsule (0.4 mg total) by mouth daily. 05/20/16   Hollice Espy, MD      Allergies    Patient has no known allergies.    Review of Systems   Review of Systems  Gastrointestinal:  Positive for abdominal pain.  All other systems reviewed and are negative.   Physical Exam Updated Vital Signs BP 104/82 (BP Location: Right Arm)   Pulse 73   Temp 98.7 F (37.1 C) (Oral)   Resp 16   Ht '5\' 1"'$  (1.549 m)   Wt 76.7 kg   LMP 04/20/2022   SpO2 100%   BMI 31.95 kg/m  Physical Exam Vitals and nursing note reviewed.  Constitutional:      Appearance: She is well-developed.  HENT:     Head: Normocephalic and atraumatic.  Cardiovascular:     Rate and Rhythm: Normal rate and regular rhythm.  Pulmonary:     Effort: Pulmonary effort is normal. No respiratory distress.  Abdominal:     Palpations: Abdomen is soft.     Tenderness: There is no guarding or rebound.     Comments: Moderate right sided abdominal tenderness  Musculoskeletal:        General: No tenderness.  Skin:    General: Skin is warm and dry.  Neurological:     Mental Status: She is alert and oriented to person, place, and time.  Psychiatric:        Behavior: Behavior normal.     ED Results / Procedures / Treatments   Labs (all labs ordered are listed, but only abnormal results are displayed) Labs Reviewed  COMPREHENSIVE METABOLIC PANEL - Abnormal; Notable for the following components:      Result Value   CO2  21 (*)    All other components within normal limits  URINALYSIS, ROUTINE W REFLEX MICROSCOPIC - Abnormal; Notable for the following components:   Color, Urine STRAW (*)    APPearance HAZY (*)    All other components within normal limits  C DIFFICILE QUICK SCREEN W PCR REFLEX    GASTROINTESTINAL PANEL BY PCR, STOOL (REPLACES STOOL CULTURE)  LIPASE, BLOOD  CBC  I-STAT BETA HCG BLOOD, ED ([facility], [facility], [facility] ONLY)    EKG None  Radiology CT Abdomen Pelvis W Contrast  Result Date: 05/19/2022 CLINICAL DATA:  Right lower quadrant abdominal pain. Nausea and vomiting for 4 days. EXAM: CT ABDOMEN AND PELVIS WITH CONTRAST TECHNIQUE: Multidetector CT imaging of the abdomen and pelvis was performed using the standard protocol following bolus administration of intravenous contrast. RADIATION DOSE REDUCTION: This exam was performed according to the departmental dose-optimization program which includes automated exposure control, adjustment of the mA and/or kV according to patient size and/or use of iterative reconstruction technique. CONTRAST:  17m OMNIPAQUE IOHEXOL 350 MG/ML SOLN COMPARISON:  05/19/2016. FINDINGS: Lower chest: No acute abnormality. Hepatobiliary: No focal liver abnormality is seen. No gallstones, gallbladder wall thickening, or biliary dilatation. Pancreas: Unremarkable. No pancreatic ductal dilatation or surrounding inflammatory changes. Spleen: Normal in size without focal abnormality. Adrenals/Urinary Tract: The adrenal glands are within normal limits. The kidneys enhance symmetrically. There is a cyst in the upper pole of the left kidney. Punctate renal calculi are noted on the right. No obstructive uropathy bilaterally. The bladder is unremarkable. Stomach/Bowel: Stomach is within normal limits. Appendix appears normal. There is mild diffuse colonic wall thickening. No obstruction, free air or pneumatosis. Vascular/Lymphatic: No significant vascular findings are present. No enlarged abdominal or  pelvic lymph nodes. Reproductive: Uterus and bilateral adnexa are unremarkable. Other: No abdominopelvic ascites. A fat containing umbilical hernia is present. Musculoskeletal: No acute or suspicious osseous abnormality. IMPRESSION: 1. Mild diffuse colonic wall thickening, suggesting colitis. 2. Nonobstructive right renal calculi. Electronically Signed   By: LBrett FairyM.D.   On: 05/19/2022 03:58    Procedures Procedures    Medications Ordered in ED Medications  dicyclomine (BENTYL) capsule 10 mg (10 mg Oral Given 05/19/22 0052)  sodium chloride 0.9 % bolus 1,000 mL (0 mLs Intravenous Stopped 05/19/22 0204)  ondansetron (ZOFRAN) injection 4 mg (4 mg Intravenous Given 05/19/22 0051)  iohexol (OMNIPAQUE) 350 MG/ML injection 75 mL (75 mLs Intravenous Contrast Given 05/19/22 0347)  metoCLOPramide (REGLAN) injection 10 mg (10 mg Intravenous Given 05/19/22 0457)    ED Course/ Medical Decision Making/ A&P  Medical Decision Making Amount and/or Complexity of Data Reviewed Radiology: ordered.  Risk Prescription drug management.   Patient here for evaluation of abdominal pain, profuse diarrhea.  Labs are essentially within normal limits.  She does have right-sided tenderness on examination.  CT abdomen pelvis was obtained to rule out appendicitis.  CT scan is not consistent with appendicitis but does demonstrate colitis.  She is negative for C. difficile.  GI panel pending at time of ED discharge.  She did have partial improvement in her abdominal cramping with dicyclomine administered in the emergency department.  Discussed with patient findings of studies.  Would not start antimotility agent at this point.  Recommend patient follows up with her PCP regarding GI panel results.  Discussed home care for diarrhea with oral fluid hydration, dicyclomine as needed cramping with return precautions.  Clinical picture is not consistent with ischemic colitis.        Final Clinical  Impression(s) / ED Diagnoses Final diagnoses:  Generalized abdominal pain  Colitis    Rx / DC Orders ED Discharge Orders          Ordered    dicyclomine (BENTYL) 20 MG tablet  2 times daily PRN        05/19/22 0446              Quintella Reichert, MD 05/19/22 [id number]

## 2022-05-20 LAB — GASTROINTESTINAL PANEL BY PCR, STOOL (REPLACES STOOL CULTURE)
